# Patient Record
Sex: Female | Born: 1985
Health system: Southern US, Community
[De-identification: ages and names within clinical notes are randomized; demographics above are authoritative.]

## PROBLEM LIST (undated history)

## (undated) DIAGNOSIS — O24419 Gestational diabetes mellitus in pregnancy, unspecified control: Secondary | ICD-10-CM

## (undated) DIAGNOSIS — O418X90 Other specified disorders of amniotic fluid and membranes, unspecified trimester, not applicable or unspecified: Secondary | ICD-10-CM

## (undated) DIAGNOSIS — O468X9 Other antepartum hemorrhage, unspecified trimester: Secondary | ICD-10-CM

## (undated) DIAGNOSIS — O021 Missed abortion: Secondary | ICD-10-CM

## (undated) DIAGNOSIS — E282 Polycystic ovarian syndrome: Secondary | ICD-10-CM

## (undated) HISTORY — DX: Gestational diabetes mellitus in pregnancy, unspecified control: O24.419

## (undated) HISTORY — PX: DILATION AND CURETTAGE OF UTERUS: SHX78

## (undated) HISTORY — DX: Polycystic ovarian syndrome: E28.2

---

## 2016-06-18 DIAGNOSIS — Z124 Encounter for screening for malignant neoplasm of cervix: Secondary | ICD-10-CM | POA: Diagnosis not present

## 2016-06-18 DIAGNOSIS — Z1151 Encounter for screening for human papillomavirus (HPV): Secondary | ICD-10-CM | POA: Diagnosis not present

## 2016-06-18 DIAGNOSIS — Z01419 Encounter for gynecological examination (general) (routine) without abnormal findings: Secondary | ICD-10-CM | POA: Diagnosis not present

## 2016-06-18 DIAGNOSIS — Z6822 Body mass index (BMI) 22.0-22.9, adult: Secondary | ICD-10-CM | POA: Diagnosis not present

## 2016-06-18 DIAGNOSIS — N912 Amenorrhea, unspecified: Secondary | ICD-10-CM | POA: Diagnosis not present

## 2016-07-08 DIAGNOSIS — N912 Amenorrhea, unspecified: Secondary | ICD-10-CM | POA: Diagnosis not present

## 2016-12-24 DIAGNOSIS — Z3169 Encounter for other general counseling and advice on procreation: Secondary | ICD-10-CM | POA: Diagnosis not present

## 2016-12-24 DIAGNOSIS — Z6822 Body mass index (BMI) 22.0-22.9, adult: Secondary | ICD-10-CM | POA: Diagnosis not present

## 2016-12-24 DIAGNOSIS — E282 Polycystic ovarian syndrome: Secondary | ICD-10-CM | POA: Diagnosis not present

## 2017-01-19 DIAGNOSIS — N970 Female infertility associated with anovulation: Secondary | ICD-10-CM | POA: Diagnosis not present

## 2017-02-09 DIAGNOSIS — Z3141 Encounter for fertility testing: Secondary | ICD-10-CM | POA: Diagnosis not present

## 2017-03-24 DIAGNOSIS — N979 Female infertility, unspecified: Secondary | ICD-10-CM | POA: Diagnosis not present

## 2017-03-24 DIAGNOSIS — N926 Irregular menstruation, unspecified: Secondary | ICD-10-CM | POA: Diagnosis not present

## 2017-03-24 DIAGNOSIS — E282 Polycystic ovarian syndrome: Secondary | ICD-10-CM | POA: Diagnosis not present

## 2017-03-24 DIAGNOSIS — Z6823 Body mass index (BMI) 23.0-23.9, adult: Secondary | ICD-10-CM | POA: Diagnosis not present

## 2017-04-21 DIAGNOSIS — Z3201 Encounter for pregnancy test, result positive: Secondary | ICD-10-CM | POA: Diagnosis not present

## 2017-04-21 DIAGNOSIS — N926 Irregular menstruation, unspecified: Secondary | ICD-10-CM | POA: Diagnosis not present

## 2017-04-24 DIAGNOSIS — N926 Irregular menstruation, unspecified: Secondary | ICD-10-CM | POA: Diagnosis not present

## 2017-05-01 ENCOUNTER — Emergency Department (HOSPITAL_BASED_OUTPATIENT_CLINIC_OR_DEPARTMENT_OTHER)
Admission: EM | Admit: 2017-05-01 | Discharge: 2017-05-01 | Disposition: A | Discharge status: Home / Self Care | Payer: BLUE CROSS/BLUE SHIELD | Attending: Emergency Medicine | Admitting: Emergency Medicine

## 2017-05-01 ENCOUNTER — Encounter (HOSPITAL_BASED_OUTPATIENT_CLINIC_OR_DEPARTMENT_OTHER): Payer: Self-pay

## 2017-05-01 ENCOUNTER — Emergency Department (HOSPITAL_BASED_OUTPATIENT_CLINIC_OR_DEPARTMENT_OTHER): Payer: BLUE CROSS/BLUE SHIELD

## 2017-05-01 DIAGNOSIS — Z3A01 Less than 8 weeks gestation of pregnancy: Secondary | ICD-10-CM | POA: Diagnosis not present

## 2017-05-01 DIAGNOSIS — O200 Threatened abortion: Secondary | ICD-10-CM | POA: Diagnosis not present

## 2017-05-01 DIAGNOSIS — O26851 Spotting complicating pregnancy, first trimester: Secondary | ICD-10-CM | POA: Diagnosis not present

## 2017-05-01 DIAGNOSIS — O209 Hemorrhage in early pregnancy, unspecified: Secondary | ICD-10-CM | POA: Diagnosis not present

## 2017-05-01 LAB — CBC
HEMATOCRIT: 40.6 % (ref 36.0–46.0)
Hemoglobin: 13.7 g/dL (ref 12.0–15.0)
MCH: 28.2 pg (ref 26.0–34.0)
MCHC: 33.7 g/dL (ref 30.0–36.0)
MCV: 83.5 fL (ref 78.0–100.0)
Platelets: 295 10*3/uL (ref 150–400)
RBC: 4.86 MIL/uL (ref 3.87–5.11)
RDW: 13.1 % (ref 11.5–15.5)
WBC: 8.8 10*3/uL (ref 4.0–10.5)

## 2017-05-01 LAB — ABO/RH: ABO/RH(D): O POS

## 2017-05-01 NOTE — ED Provider Notes (Signed)
I saw and evaluated the patient, reviewed the resident's note and I agree with the findings and plan.   EKG Interpretation None       Patient presents with a threatened miscarriage. Her ultrasound shows a possible bicornuate uterus with implantation in the left horn. Her bleeding has slowed down. There is no abdominal pain. She is not unstable. I discussed with her OB/GYN practice on-call, Dr. Amado Nash, who notes it would be odd for this to be a true bicornuate uterus given she had an unremarkable ultrasound in November 2017. Asks for a CBC for baseline hemoglobin, give patient return precautions for bleeding, and follow-up with their practice on Monday in 3 days. She already has an ultrasound scheduled for then. She is O+, no need for rhogam.   Pricilla Loveless, MD 05/01/17 2240

## 2017-05-01 NOTE — ED Notes (Signed)
Pt returned from [place], resting comfortably on stretcher in NAD.

## 2017-05-01 NOTE — ED Provider Notes (Signed)
[facility] Provider Note   CSN: [id number] Arrival date & time: 05/01/17  1902  History   Chief Complaint Chief Complaint  Patient presents with  . Vaginal Bleeding    HPI Bonnie Thornton is a 31 y.o. female presenting with vaginal bleeding that she noticed right after work. She is [redacted] weeks pregnant. She went to the bathroom after work and noticed blood when she wiped. She put on a pad and proceeded to come to ED. She felt more blood leaking out during transport here. She denies any pelvic pain or vaginal cramping. No recent intercourse. No abnormal vaginal discharge. No urinary complaints. No fevers or chills. This is her first pregnancy. She has PCOS and had difficulty conceiving. Husband is at bedside.   HPI  History reviewed. No pertinent past medical history.  There are no active problems to display for this patient.   History reviewed. No pertinent surgical history.  OB History    Gravida Para Term Preterm AB Living   1             SAB TAB Ectopic Multiple Live Births                 Home Medications    Prior to Admission medications   Medication Sig Start Date End Date Taking? Authorizing Provider  METFORMIN HCL PO Take by mouth.   Yes [provider]   Family History No family history on file.  Social History Social History  Substance Use Topics  . Smoking status: Never Smoker  . Smokeless tobacco: Never Used  . Alcohol use No   Allergies   Patient has no known allergies.  Review of Systems Review of Systems  Constitutional: Negative for chills and fatigue.  Respiratory: Negative for shortness of breath.   Cardiovascular: Negative for chest pain.  Gastrointestinal: Negative for abdominal pain, constipation, diarrhea, nausea and vomiting.  Genitourinary: Positive for vaginal bleeding. Negative for dysuria, flank pain, hematuria, pelvic pain, urgency, vaginal discharge and vaginal pain.  Musculoskeletal: Negative for back pain and  myalgias.  Skin: Negative for rash.  Neurological: Negative for numbness and headaches.    Physical Exam Updated Vital Signs BP 107/67 (BP Location: Right Arm)   Pulse 77   Temp 98.2 F (36.8 C) (Oral)   Resp 16   Ht  (1.626 m)   Wt 60.8 kg (134 lb 0.6 oz)   LMP 03/02/2017   SpO2 100%   BMI 23.01 kg/m   Physical Exam  Constitutional: She is oriented to person, place, and time. She appears well-developed and well-nourished. No distress.  HENT:  Head: Normocephalic and atraumatic.  Cardiovascular: Normal rate and regular rhythm.   Pulmonary/Chest: Effort normal and breath sounds normal.  Abdominal: Soft. There is no tenderness.  Genitourinary: Rectum normal. There is no lesion on the right labia. There is no lesion on the left labia. There is bleeding in the vagina.  Genitourinary Comments: Moderate amount of blood in vaginal canal, no clots or fetal tissue appreciated. Bleeding coming from non-dilated cervical os  Musculoskeletal: Normal range of motion. She exhibits no tenderness.  Neurological: She is alert and oriented to person, place, and time. She exhibits normal muscle tone.  Skin: Skin is warm and dry. No rash noted.  Psychiatric: She has a normal mood and affect. Her behavior is normal.     ED Treatments / Results  Labs (all labs ordered are listed, but only abnormal results are displayed) Labs Reviewed  CBC  ABO/RH    EKG  EKG Interpretation None       Radiology US Ob Comp < 14 Wks  Result Date: 05/01/2017 CLINICAL DATA:  Pregnant patient in first-trimester pregnancy with vaginal bleeding today. EXAM: OBSTETRIC <14 WK [place] AND TRANSVAGINAL OB US TECHNIQUE: Both transabdominal and transvaginal ultrasound examinations were performed for complete evaluation of the gestation as well as the maternal uterus, adnexal regions, and pelvic cul-de-sac. Transvaginal technique was performed to assess early pregnancy. COMPARISON:  None. FINDINGS: Intrauterine  gestational sac: Single, located high in position in the left uterine horn. Yolk sac:  Visualized. Embryo:  Visualized. Cardiac Activity: Visualized. Heart Rate: 119  bpm CRL:  5.3  mm   6 w   2 d                  [place] EDC: 12/24/2017 Subchorionic hemorrhage:  Small measuring 9 x 9 x 5 mm. Maternal uterus/adnexae: Probable bicornuate uterus with IUP in the left uterine horn located peripherally. The right endometrial cavity is thickened measuring 2.3 cm. Small amount of fluid in the cervical canal distally. Neither ovary was visualized. No pelvic free fluid. IMPRESSION: Bicornuate uterus with live IUP in the left uterine horn, estimated gestational age [redacted] weeks 2 days for estimated date of delivery 12/23/2017. The gestational sac appear eccentric in location raising concern for interstitial pregnancy, however on clear is this is simply related to uterine anomaly. There is a small amount of subchorionic hemorrhage. Recommend obstetric consultation. These results were called by telephone at the time of interpretation on 05/01/2017 at 9:45 pm to Dr. Pricilla Loveless , who verbally acknowledged these results. Electronically Signed   By: Rubye Oaks M.D.   On: 05/01/2017 21:46   US Ob Transvaginal  Result Date: 05/01/2017 CLINICAL DATA:  Pregnant patient in first-trimester pregnancy with vaginal bleeding today. EXAM: OBSTETRIC <14 WK [place] AND TRANSVAGINAL OB US TECHNIQUE: Both transabdominal and transvaginal ultrasound examinations were performed for complete evaluation of the gestation as well as the maternal uterus, adnexal regions, and pelvic cul-de-sac. Transvaginal technique was performed to assess early pregnancy. COMPARISON:  None. FINDINGS: Intrauterine gestational sac: Single, located high in position in the left uterine horn. Yolk sac:  Visualized. Embryo:  Visualized. Cardiac Activity: Visualized. Heart Rate: 119  bpm CRL:  5.3  mm   6 w   2 d                  [place] EDC: 12/24/2017 Subchorionic hemorrhage:   Small measuring 9 x 9 x 5 mm. Maternal uterus/adnexae: Probable bicornuate uterus with IUP in the left uterine horn located peripherally. The right endometrial cavity is thickened measuring 2.3 cm. Small amount of fluid in the cervical canal distally. Neither ovary was visualized. No pelvic free fluid. IMPRESSION: Bicornuate uterus with live IUP in the left uterine horn, estimated gestational age [redacted] weeks 2 days for estimated date of delivery 12/23/2017. The gestational sac appear eccentric in location raising concern for interstitial pregnancy, however on clear is this is simply related to uterine anomaly. There is a small amount of subchorionic hemorrhage. Recommend obstetric consultation. These results were called by telephone at the time of interpretation on 05/01/2017 at 9:45 pm to Dr. Pricilla Loveless , who verbally acknowledged these results. Electronically Signed   By: Rubye Oaks M.D.   On: 05/01/2017 21:46    Procedures Procedures (including critical care time)  Medications Ordered in ED Medications - No data to display   Initial Impression /  Assessment and Plan / ED Course  I have reviewed the triage vital signs and the nursing notes.  Pertinent labs & imaging results that were available during my care of the patient were reviewed by me and considered in my medical decision making (see chart for details).     31 year old G1P0000 at [redacted] weeks GA presenting with vaginal bleeding consistent with threatened miscarriage. Moderate amount of blood on speculum exam, no fetal tissue appreciated, cervical os appears closed. US demonstrates [redacted]w[redacted]d IUP with small subchorionic hemorrhage. Some evidence of bicornuate uterus on [place]. Discussed case with patient's OB at [facility] (sees Dr. Juliene Pina). Patient has follow up at their practice for ultrasound on Monday 9/17. Stable for discharge home. Return precautions given for pelvic pain/cramping, further bleeding. Advised patient to go to [facility]  for pregnancy related concerns. Patient verbalized understanding and agreement with plan.   Final Clinical Impressions(s) / ED Diagnoses   Final diagnoses:  Threatened miscarriage    New Prescriptions New Prescriptions   No medications on file    Dolores Patty, DO PGY-2, [facility] 05/01/2017 10:28 PM    Tillman Sers, DO 05/01/17 2228    Pricilla Loveless, MD 05/01/17 2306

## 2017-05-01 NOTE — ED Notes (Signed)
ED Provider at bedside. 

## 2017-05-01 NOTE — ED Triage Notes (Addendum)
C/o vaginal bleeding x 30 min -pt estimates she is [redacted] weeks pregnant-BHCG at OB/GYN-due for [place] next week-NAD-steady gait

## 2017-05-01 NOTE — ED Notes (Signed)
Patient transported to Ultrasound 

## 2017-05-04 DIAGNOSIS — Z3A01 Less than 8 weeks gestation of pregnancy: Secondary | ICD-10-CM | POA: Diagnosis not present

## 2017-05-04 DIAGNOSIS — Z3201 Encounter for pregnancy test, result positive: Secondary | ICD-10-CM | POA: Diagnosis not present

## 2017-05-04 DIAGNOSIS — O4691 Antepartum hemorrhage, unspecified, first trimester: Secondary | ICD-10-CM | POA: Diagnosis not present

## 2017-05-18 DIAGNOSIS — O418X90 Other specified disorders of amniotic fluid and membranes, unspecified trimester, not applicable or unspecified: Secondary | ICD-10-CM

## 2017-05-18 DIAGNOSIS — Z3201 Encounter for pregnancy test, result positive: Secondary | ICD-10-CM | POA: Diagnosis not present

## 2017-05-18 HISTORY — DX: Other specified disorders of amniotic fluid and membranes, unspecified trimester, not applicable or unspecified: O41.8X90

## 2017-06-01 DIAGNOSIS — Z3A01 Less than 8 weeks gestation of pregnancy: Secondary | ICD-10-CM | POA: Diagnosis not present

## 2017-06-01 DIAGNOSIS — O418X30 Other specified disorders of amniotic fluid and membranes, third trimester, not applicable or unspecified: Secondary | ICD-10-CM | POA: Diagnosis not present

## 2017-06-08 DIAGNOSIS — Z3A11 11 weeks gestation of pregnancy: Secondary | ICD-10-CM | POA: Diagnosis not present

## 2017-06-08 DIAGNOSIS — Z3491 Encounter for supervision of normal pregnancy, unspecified, first trimester: Secondary | ICD-10-CM | POA: Diagnosis not present

## 2017-06-08 DIAGNOSIS — Z3682 Encounter for antenatal screening for nuchal translucency: Secondary | ICD-10-CM | POA: Diagnosis not present

## 2017-06-08 DIAGNOSIS — O208 Other hemorrhage in early pregnancy: Secondary | ICD-10-CM | POA: Diagnosis not present

## 2017-06-08 DIAGNOSIS — Z36 Encounter for antenatal screening for chromosomal anomalies: Secondary | ICD-10-CM | POA: Diagnosis not present

## 2017-06-08 DIAGNOSIS — Z3689 Encounter for other specified antenatal screening: Secondary | ICD-10-CM | POA: Diagnosis not present

## 2017-06-08 DIAGNOSIS — Z118 Encounter for screening for other infectious and parasitic diseases: Secondary | ICD-10-CM | POA: Diagnosis not present

## 2017-06-08 DIAGNOSIS — O418X10 Other specified disorders of amniotic fluid and membranes, first trimester, not applicable or unspecified: Secondary | ICD-10-CM | POA: Diagnosis not present

## 2017-06-09 ENCOUNTER — Inpatient Hospital Stay (HOSPITAL_COMMUNITY): Payer: BLUE CROSS/BLUE SHIELD

## 2017-06-09 ENCOUNTER — Encounter: Payer: Self-pay | Admitting: Student

## 2017-06-09 ENCOUNTER — Inpatient Hospital Stay (EMERGENCY_DEPARTMENT_HOSPITAL)
Admission: AD | Admit: 2017-06-09 | Discharge: 2017-06-09 | Disposition: A | Discharge status: Home / Self Care | Payer: BLUE CROSS/BLUE SHIELD | Source: Ambulatory Visit | Attending: Obstetrics and Gynecology | Admitting: Obstetrics and Gynecology

## 2017-06-09 DIAGNOSIS — O021 Missed abortion: Secondary | ICD-10-CM | POA: Diagnosis not present

## 2017-06-09 DIAGNOSIS — Z3A11 11 weeks gestation of pregnancy: Secondary | ICD-10-CM

## 2017-06-09 DIAGNOSIS — O418X10 Other specified disorders of amniotic fluid and membranes, first trimester, not applicable or unspecified: Secondary | ICD-10-CM | POA: Diagnosis not present

## 2017-06-09 DIAGNOSIS — O208 Other hemorrhage in early pregnancy: Secondary | ICD-10-CM

## 2017-06-09 DIAGNOSIS — O209 Hemorrhage in early pregnancy, unspecified: Secondary | ICD-10-CM

## 2017-06-09 DIAGNOSIS — R109 Unspecified abdominal pain: Secondary | ICD-10-CM

## 2017-06-09 HISTORY — DX: Other specified disorders of amniotic fluid and membranes, unspecified trimester, not applicable or unspecified: O41.8X90

## 2017-06-09 HISTORY — DX: Other antepartum hemorrhage, unspecified trimester: O46.8X9

## 2017-06-09 LAB — URINALYSIS, ROUTINE W REFLEX MICROSCOPIC

## 2017-06-09 LAB — URINALYSIS, MICROSCOPIC (REFLEX)

## 2017-06-09 MED ORDER — OXYCODONE-ACETAMINOPHEN 5-325 MG PO TABS: 1.0000 | ORAL_TABLET | ORAL | 0 refills | Status: DC | PRN

## 2017-06-09 NOTE — MAU Note (Signed)
Pt reports she is [redacted] weeks pregnant and is having some bleeding off/on throughout the pregnancy but today she began passing a lot of clots and this pm she began having lower abd cramping. +FHT's in office today.

## 2017-06-09 NOTE — Discharge Instructions (Signed)

## 2017-06-09 NOTE — MAU Note (Signed)
Went to Doppler FHR- the doppler was reading 165-169 but could not physically hear HR. Notified provider and US ordered.

## 2017-06-09 NOTE — MAU Provider Note (Signed)
History     CSN: [id number]  Arrival date and time: 06/09/17 7829   First Provider Initiated Contact with Patient 06/09/17 1942      Chief Complaint  Patient presents with  . Abdominal Pain  . Vaginal Bleeding   HPI Bonnie Thornton is a 31 y.o. G1P0 at [redacted]w[redacted]d who presents with vaginal bleeding & abdominal cramping. Being followed in office for subchorionic hematoma that was measuring 4 cm in office yesterday. Reports increase in bleeding with passage of clots since last night. Went to office this morning d/t bleeding & states they heard FHTs by doppler this morning. Bleeding has slowed down since last night; has passed occasional quarter sized clot. Lower abdominal cramping since this afternoon. Rates pain 7/10. Hasn't treated pain.  OB History    Gravida Para Term Preterm AB Living   1             SAB TAB Ectopic Multiple Live Births                  Past Medical History:  Diagnosis Date  . Subcortical hemorrhage [facility])     Past Surgical History:  Procedure Laterality Date  . NO PAST SURGERIES      History reviewed. No pertinent family history.  Social History  Substance Use Topics  . Smoking status: Never Smoker  . Smokeless tobacco: Never Used  . Alcohol use No    Allergies: No Known Allergies  Prescriptions Prior to Admission  Medication Sig Dispense Refill Last Dose  . metFORMIN (GLUCOPHAGE-XR) 750 MG 24 hr tablet Take 750 mg by mouth daily with breakfast.   06/08/2017 at Unknown time  . Prenatal Vit-Fe Fumarate-FA (MULTIVITAMIN-PRENATAL) 27-0.8 MG TABS tablet Take 1 tablet by mouth daily at 12 noon.   06/09/2017 at Unknown time  . progesterone (PROMETRIUM) 200 MG capsule Take 200 mg by mouth daily.   06/08/2017 at Unknown time    Review of Systems  Constitutional: Negative.   Gastrointestinal: Positive for abdominal pain.  Genitourinary: Positive for vaginal bleeding.   Physical Exam   Blood pressure 95/73, pulse 82, temperature 97.9 F (36.6 C),  temperature source Oral, resp. rate 17, height 5\' 4"  (1.626 m), weight 132 lb (59.9 kg), last menstrual period 03/02/2017, SpO2 100 %.  Physical Exam  Nursing note and vitals reviewed. Constitutional: She is oriented to person, place, and time. She appears well-developed and well-nourished. No distress.  HENT:  Head: Normocephalic and atraumatic.  Eyes: Conjunctivae are normal. Right eye exhibits no discharge. Left eye exhibits no discharge. No scleral icterus.  Neck: Normal range of motion.  Cardiovascular: Normal rate, regular rhythm and normal heart sounds.   No murmur heard. Respiratory: Effort normal and breath sounds normal. No respiratory distress. She has no wheezes.  GI: Soft. She exhibits no distension. There is tenderness in the suprapubic area. There is no rigidity and no guarding.  Genitourinary:  Genitourinary Comments: Dilation: Closed Exam by:: Judeth Horn, NP  Small amount of dark red blood on glove  Neurological: She is alert and oriented to person, place, and time.  Skin: Skin is warm and dry. She is not diaphoretic.  Psychiatric: She has a normal mood and affect. Her behavior is normal. Judgment and thought content normal.    MAU Course  Procedures Results for orders placed or performed during the hospital encounter of 06/09/17 (from the past 24 hour(s))  Urinalysis, Routine w reflex microscopic     Status: Abnormal   Collection Time: 06/09/17  7:01 PM  Result Value Ref Range   Color, Urine RED (A) YELLOW   APPearance TURBID (A) CLEAR   Specific Gravity, Urine  1.005 - 1.030    TEST NOT REPORTED DUE TO COLOR INTERFERENCE OF URINE PIGMENT   pH  5.0 - 8.0    TEST NOT REPORTED DUE TO COLOR INTERFERENCE OF URINE PIGMENT   Glucose, UA (A) NEGATIVE mg/dL    TEST NOT REPORTED DUE TO COLOR INTERFERENCE OF URINE PIGMENT   Hgb urine dipstick (A) NEGATIVE    TEST NOT REPORTED DUE TO COLOR INTERFERENCE OF URINE PIGMENT   Bilirubin Urine (A) NEGATIVE    TEST NOT  REPORTED DUE TO COLOR INTERFERENCE OF URINE PIGMENT   Ketones, ur (A) NEGATIVE mg/dL    TEST NOT REPORTED DUE TO COLOR INTERFERENCE OF URINE PIGMENT   Protein, ur (A) NEGATIVE mg/dL    TEST NOT REPORTED DUE TO COLOR INTERFERENCE OF URINE PIGMENT   Nitrite (A) NEGATIVE    TEST NOT REPORTED DUE TO COLOR INTERFERENCE OF URINE PIGMENT   Leukocytes, UA (A) NEGATIVE    TEST NOT REPORTED DUE TO COLOR INTERFERENCE OF URINE PIGMENT  Urinalysis, Microscopic (reflex)     Status: Abnormal   Collection Time: 06/09/17  7:01 PM  Result Value Ref Range   RBC / HPF TOO NUMEROUS TO COUNT 0 - 5 RBC/hpf   WBC, UA 0-5 0 - 5 WBC/hpf   Bacteria, UA FEW (A) NONE SEEN   Squamous Epithelial / LPF 0-5 (A) NONE SEEN   US Ob Comp Less 14 Wks  Result Date: 06/09/2017 CLINICAL DATA:  Lower abdominal cramping and vaginal bleeding EXAM: OBSTETRIC <14 WK ULTRASOUND TECHNIQUE: Transabdominal ultrasound was performed for evaluation of the gestation as well as the maternal uterus and adnexal regions. COMPARISON:  05/01/2017 FINDINGS: Intrauterine gestational sac: Visible Yolk sac:  Not visible Embryo:  Visible Cardiac Activity: Not visible CRL:   49  mm   11 w 4 d                  [place] EDC: 12/25/2017 Subchorionic hemorrhage:  None visualized. Maternal uterus/adnexae: Both ovaries are nonvisualized. IMPRESSION: 1. Intrauterine pregnancy with crown-rump length of 49 mm an no fetal cardiac activity. Findings meet definitive criteria for failed pregnancy. This follows SRU consensus guidelines: Diagnostic Criteria for Nonviable Pregnancy Early in the First Trimester. Macy Mis J Med [phone]. 2. 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 Findings could be secondary to septate uterus configuration versus bicornuate uterus. Nonemergent pelvic MRI if not already performed could help differentiate between the 2 entities. Electronically Signed   By: Jasmine Pang M.D.   On: 06/09/2017 20:27   MDM O positive RN  unsure if she got FHTs by doppler; doppler attempted by myself & unable to hear FHTs. Ultrasound ordered for viability.  Cervix closed/thick/posterior.  Care turned over to Dr. Randol Kern, Denny Peon, NP 06/09/2017 8:07 PM   [place] read as above. Patient with no FHT. Missed abortion. Discussed with on-call provider Dr. Billy Coast. Assessment and Plan   1. [redacted] weeks gestation of pregnancy   2. Vaginal bleeding in pregnancy, first trimester   3. Vaginal bleeding affecting early pregnancy   4. Abdominal cramping   5. Missed abortion    Miscarriage precautions given Hemodynamically stable for discharge Patient to return if symptoms or her condition worsens Rx for pain medication given To follow-up in office tomorrow with [facility] provider for further management  Caryl AdaJazma Phelps, DO OB Fellow  Discussed with provider See note Minimal bleeding and cramping To be seen in office in am for plan of care

## 2017-06-09 NOTE — Progress Notes (Signed)
Notified by provider Presented with cramping and spotting. BP 95/73 (BP Location: Right Arm)   Pulse 82   Temp 97.9 F (36.6 C) (Oral)   Resp 17   Ht 5\' 4"  (1.626 m)   Wt 59.9 kg (132 lb)   LMP 03/02/2017   SpO2 100%   BMI 22.66 kg/m   Sono c/w MAB Non urgent clinical situation Pt to be seen tomorrow by Dr. Juliene PinaMody to determine plan of management

## 2017-06-10 ENCOUNTER — Ambulatory Visit (HOSPITAL_COMMUNITY)
Admission: AD | Admit: 2017-06-10 | Discharge: 2017-06-10 | Disposition: A | Discharge status: Home / Self Care | Payer: BLUE CROSS/BLUE SHIELD | Source: Ambulatory Visit | Attending: Obstetrics and Gynecology | Admitting: Obstetrics and Gynecology

## 2017-06-10 ENCOUNTER — Ambulatory Visit: Admit: 2017-06-10 | Payer: BLUE CROSS/BLUE SHIELD | Admitting: Obstetrics & Gynecology

## 2017-06-10 ENCOUNTER — Encounter (HOSPITAL_COMMUNITY)
Admission: AD | Disposition: A | Discharge status: Home / Self Care | Payer: Self-pay | Source: Ambulatory Visit | Attending: Obstetrics and Gynecology

## 2017-06-10 ENCOUNTER — Encounter (HOSPITAL_COMMUNITY): Payer: Self-pay | Admitting: *Deleted

## 2017-06-10 ENCOUNTER — Other Ambulatory Visit: Payer: Self-pay | Admitting: Obstetrics & Gynecology

## 2017-06-10 ENCOUNTER — Inpatient Hospital Stay (HOSPITAL_COMMUNITY): Payer: BLUE CROSS/BLUE SHIELD | Admitting: Anesthesiology

## 2017-06-10 DIAGNOSIS — O021 Missed abortion: Secondary | ICD-10-CM | POA: Diagnosis not present

## 2017-06-10 HISTORY — PX: DILATION AND EVACUATION: SHX1459

## 2017-06-10 HISTORY — DX: Missed abortion: O02.1

## 2017-06-10 LAB — CBC
HCT: 38.9 % (ref 36.0–46.0)
Hemoglobin: 13.3 g/dL (ref 12.0–15.0)
MCH: 28.4 pg (ref 26.0–34.0)
MCHC: 34.2 g/dL (ref 30.0–36.0)
MCV: 82.9 fL (ref 78.0–100.0)
PLATELETS: 282 10*3/uL (ref 150–400)
RBC: 4.69 MIL/uL (ref 3.87–5.11)
RDW: 13.3 % (ref 11.5–15.5)
WBC: 10.1 10*3/uL (ref 4.0–10.5)

## 2017-06-10 SURGERY — DILATION AND EVACUATION, UTERUS
Anesthesia: General | Site: Vagina

## 2017-06-10 MED ORDER — CEFAZOLIN SODIUM-DEXTROSE 2-3 GM-%(50ML) IV SOLR
INTRAVENOUS | Status: DC | PRN
  Administered 2017-06-10: 2 g via INTRAVENOUS

## 2017-06-10 MED ORDER — FENTANYL CITRATE (PF) 100 MCG/2ML IJ SOLN
INTRAMUSCULAR | Status: AC
  Filled 2017-06-10: qty 2

## 2017-06-10 MED ORDER — LIDOCAINE HCL (CARDIAC) 20 MG/ML IV SOLN
INTRAVENOUS | Status: DC | PRN
  Administered 2017-06-10: 80 mg via INTRAVENOUS

## 2017-06-10 MED ORDER — DEXAMETHASONE SODIUM PHOSPHATE 10 MG/ML IJ SOLN
INTRAMUSCULAR | Status: AC
  Filled 2017-06-10: qty 1

## 2017-06-10 MED ORDER — OXYCODONE HCL 5 MG PO TABS
5.0000 mg | ORAL_TABLET | Freq: Once | ORAL | Status: AC | PRN
  Administered 2017-06-10: 5 mg via ORAL

## 2017-06-10 MED ORDER — MEPERIDINE HCL 25 MG/ML IJ SOLN: 6.2500 mg | INTRAMUSCULAR | Status: DC | PRN

## 2017-06-10 MED ORDER — FENTANYL CITRATE (PF) 100 MCG/2ML IJ SOLN
INTRAMUSCULAR | Status: DC | PRN
  Administered 2017-06-10: 100 ug via INTRAVENOUS

## 2017-06-10 MED ORDER — SOD CITRATE-CITRIC ACID 500-334 MG/5ML PO SOLN
30.0000 mL | Freq: Once | ORAL | Status: AC
  Administered 2017-06-10: 30 mL via ORAL
  Filled 2017-06-10: qty 15

## 2017-06-10 MED ORDER — DEXAMETHASONE SODIUM PHOSPHATE 10 MG/ML IJ SOLN
INTRAMUSCULAR | Status: DC | PRN
  Administered 2017-06-10: 10 mg via INTRAVENOUS

## 2017-06-10 MED ORDER — PROMETHAZINE HCL 25 MG/ML IJ SOLN: 6.2500 mg | INTRAMUSCULAR | Status: DC | PRN

## 2017-06-10 MED ORDER — PROPOFOL 10 MG/ML IV BOLUS
INTRAVENOUS | Status: DC | PRN
Start: 2017-06-10 — End: 2017-06-10
  Administered 2017-06-10: 200 mg via INTRAVENOUS

## 2017-06-10 MED ORDER — SCOPOLAMINE 1 MG/3DAYS TD PT72
MEDICATED_PATCH | TRANSDERMAL | Status: AC
  Filled 2017-06-10: qty 1

## 2017-06-10 MED ORDER — CEFAZOLIN SODIUM-DEXTROSE 2-3 GM-%(50ML) IV SOLR
INTRAVENOUS | Status: AC
  Filled 2017-06-10: qty 50

## 2017-06-10 MED ORDER — OXYCODONE HCL 5 MG/5ML PO SOLN: 5.0000 mg | Freq: Once | ORAL | Status: AC | PRN

## 2017-06-10 MED ORDER — METHYLERGONOVINE MALEATE 0.2 MG/ML IJ SOLN
INTRAMUSCULAR | Status: AC
  Filled 2017-06-10: qty 1

## 2017-06-10 MED ORDER — PHENYLEPHRINE HCL 10 MG/ML IJ SOLN
INTRAMUSCULAR | Status: DC | PRN
  Administered 2017-06-10: 80 ug via INTRAVENOUS

## 2017-06-10 MED ORDER — CHLOROPROCAINE HCL 1 % IJ SOLN
INTRAMUSCULAR | Status: DC | PRN
  Administered 2017-06-10: 10 mL

## 2017-06-10 MED ORDER — ONDANSETRON HCL 4 MG/2ML IJ SOLN
INTRAMUSCULAR | Status: AC
  Filled 2017-06-10: qty 2

## 2017-06-10 MED ORDER — MIDAZOLAM HCL 2 MG/2ML IJ SOLN
INTRAMUSCULAR | Status: AC
  Filled 2017-06-10: qty 2

## 2017-06-10 MED ORDER — SCOPOLAMINE 1 MG/3DAYS TD PT72
MEDICATED_PATCH | TRANSDERMAL | Status: DC | PRN
  Administered 2017-06-10: 1 via TRANSDERMAL

## 2017-06-10 MED ORDER — METHYLERGONOVINE MALEATE 0.2 MG/ML IJ SOLN
INTRAMUSCULAR | Status: DC | PRN
  Administered 2017-06-10: 0.2 mg via INTRAMUSCULAR

## 2017-06-10 MED ORDER — HYDROMORPHONE HCL 1 MG/ML IJ SOLN: 0.2500 mg | INTRAMUSCULAR | Status: DC | PRN

## 2017-06-10 MED ORDER — KETOROLAC TROMETHAMINE 30 MG/ML IJ SOLN: 30.0000 mg | Freq: Once | INTRAMUSCULAR | Status: DC | PRN

## 2017-06-10 MED ORDER — LACTATED RINGERS IV SOLN
INTRAVENOUS | Status: DC
  Administered 2017-06-10 (×2): via INTRAVENOUS

## 2017-06-10 MED ORDER — PHENYLEPHRINE 40 MCG/ML (10ML) SYRINGE FOR IV PUSH (FOR BLOOD PRESSURE SUPPORT)
PREFILLED_SYRINGE | INTRAVENOUS | Status: AC
  Filled 2017-06-10: qty 10

## 2017-06-10 MED ORDER — LIDOCAINE HCL (CARDIAC) 20 MG/ML IV SOLN
INTRAVENOUS | Status: AC
  Filled 2017-06-10: qty 5

## 2017-06-10 MED ORDER — PROPOFOL 10 MG/ML IV BOLUS
INTRAVENOUS | Status: AC
  Filled 2017-06-10: qty 20

## 2017-06-10 MED ORDER — FAMOTIDINE IN NACL 20-0.9 MG/50ML-% IV SOLN
20.0000 mg | Freq: Once | INTRAVENOUS | Status: AC
  Administered 2017-06-10: 20 mg via INTRAVENOUS
  Filled 2017-06-10: qty 50

## 2017-06-10 MED ORDER — ONDANSETRON HCL 4 MG/2ML IJ SOLN
INTRAMUSCULAR | Status: DC | PRN
  Administered 2017-06-10: 4 mg via INTRAVENOUS

## 2017-06-10 MED ORDER — OXYCODONE HCL 5 MG PO TABS
ORAL_TABLET | ORAL | Status: AC
  Filled 2017-06-10: qty 1

## 2017-06-10 MED ORDER — LACTATED RINGERS IV BOLUS (SEPSIS)
1000.0000 mL | Freq: Once | INTRAVENOUS | Status: AC
  Administered 2017-06-10: 1000 mL via INTRAVENOUS

## 2017-06-10 MED ORDER — IBUPROFEN 200 MG PO TABS: 600.0000 mg | ORAL_TABLET | Freq: Four times a day (QID) | ORAL | 0 refills | Status: DC | PRN

## 2017-06-10 SURGICAL SUPPLY — 16 items
DECANTER SPIKE VIAL GLASS SM (MISCELLANEOUS) ×2 IMPLANT
GLOVE BIO SURGEON STRL SZ7 (GLOVE) ×2 IMPLANT
GLOVE BIOGEL PI IND STRL 7.0 (GLOVE) ×2 IMPLANT
GLOVE BIOGEL PI INDICATOR 7.0 (GLOVE) ×2
GOWN STRL REUS W/TWL LRG LVL3 (GOWN DISPOSABLE) ×4 IMPLANT
KIT BERKELEY 1ST TRIMESTER 3/8 (MISCELLANEOUS) ×2 IMPLANT
NS IRRIG 1000ML POUR BTL (IV SOLUTION) ×2 IMPLANT
PACK VAGINAL MINOR WOMEN LF (CUSTOM PROCEDURE TRAY) ×2 IMPLANT
PAD OB MATERNITY 4.3X12.25 (PERSONAL CARE ITEMS) ×2 IMPLANT
PAD PREP 24X48 CUFFED NSTRL (MISCELLANEOUS) ×2 IMPLANT
SET BERKELEY SUCTION TUBING (SUCTIONS) ×2 IMPLANT
TOWEL OR 17X24 6PK STRL BLUE (TOWEL DISPOSABLE) ×4 IMPLANT
VACURETTE 10 RIGID CVD (CANNULA) ×2 IMPLANT
VACURETTE 7MM CVD STRL WRAP (CANNULA) IMPLANT
VACURETTE 8 RIGID CVD (CANNULA) IMPLANT
VACURETTE 9 RIGID CVD (CANNULA) IMPLANT

## 2017-06-10 NOTE — H&P (Addendum)
Bonnie Thornton is an 31 y.o. female G1, 11.6 wks, missed abortion by sono last night in MAU and today in office.  Bleeding off and on since start of pregnancy  Healthy female. PCOS  Patient's last menstrual period was 03/02/2017.    Past Medical History:  Diagnosis Date  . Subchorionic hematoma 05/2017    Past Surgical History:  Procedure Laterality Date  . NO PAST SURGERIES      History reviewed. No pertinent family history.  Social History:  reports that she has never smoked. She has never used smokeless tobacco. She reports that she does not drink alcohol or use drugs.  Allergies: No Known Allergies  Prescriptions Prior to Admission  Medication Sig Dispense Refill Last Dose  . metFORMIN (GLUCOPHAGE-XR) 750 MG 24 hr tablet Take 750 mg by mouth daily with breakfast.   06/10/2017 at Unknown time  . Prenatal Vit-Fe Fumarate-FA (MULTIVITAMIN-PRENATAL) 27-0.8 MG TABS tablet Take 1 tablet by mouth daily at 12 noon.   06/09/2017 at Unknown time  . progesterone (PROMETRIUM) 200 MG capsule Take 200 mg by mouth daily.   06/09/2017 at Unknown time  . oxyCODONE-acetaminophen (PERCOCET/ROXICET) 5-325 MG tablet Take 1 tablet by mouth every 4 (four) hours as needed for severe pain. 15 tablet 0     ROS bleeding  Blood pressure 105/65, pulse 82, temperature (!) 97.4 F (36.3 C), temperature source Oral, resp. rate 18, last menstrual period 03/02/2017. Physical Exam Physical exam:  A&O x 3, no acute distress. Pleasant HEENT neg, no thyromegaly Lungs CTA bilat CV RRR, S1S2 normal Abdo soft, non tender, non acute Extr no edema/ tenderness Pelvic Cx f tip, bleeding  FHT  Absent  Results for orders placed or performed during the hospital encounter of 06/10/17 (from the past 24 hour(s))  CBC     Status: None   Collection Time: 06/10/17  3:53 PM  Result Value Ref Range   WBC 10.1 4.0 - 10.5 K/uL   RBC 4.69 3.87 - 5.11 MIL/uL   Hemoglobin 13.3 12.0 - 15.0 g/dL   HCT 40.938.9 81.136.0 - 91.446.0 %    MCV 82.9 78.0 - 100.0 fL   MCH 28.4 26.0 - 34.0 pg   MCHC 34.2 30.0 - 36.0 g/dL   RDW 78.213.3 95.611.5 - 21.315.5 %   Platelets 282 150 - 400 K/uL    [place] Ob Comp Less 14 Wks  Result Date: 06/09/2017 CLINICAL DATA:  Lower abdominal cramping and vaginal bleeding EXAM: OBSTETRIC <14 WK ULTRASOUND TECHNIQUE: Transabdominal ultrasound was performed for evaluation of the gestation as well as the maternal uterus and adnexal regions. COMPARISON:  05/01/2017 FINDINGS: Intrauterine gestational sac: Visible Yolk sac:  Not visible Embryo:  Visible Cardiac Activity: Not visible CRL:   49  mm   11 w 4 d                  US EDC: 12/25/2017 Subchorionic hemorrhage:  None visualized. Maternal uterus/adnexae: Both ovaries are nonvisualized. IMPRESSION: 1. Intrauterine pregnancy with crown-rump length of 49 mm an no fetal cardiac activity. Findings meet definitive criteria for failed pregnancy. This follows SRU consensus guidelines: Diagnostic Criteria for Nonviable Pregnancy Early in the First Trimester. Macy Mis Engl J Med [phone]. 2. 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 Findings could be secondary to septate uterus configuration versus bicornuate uterus. Nonemergent pelvic MRI if not already performed could help differentiate between the 2 entities. Electronically Signed   By: Jasmine PangKim  Fujinaga M.D.   On: 06/09/2017 20:27  Assessment/Plan: 31 yo G1, 11.6 wks, missed abortion after bleeding off and on with subchorionic hemorrhage since start of pregnancy.  Plan suction dilatation and evacuation.  Rh positive Risks/complications of surgery reviewed incl infection, damage to uterus, scarring, bleeding, damage to internal organs including bladder, bowels, ureters, blood vessels, other risks from anesthesia, VTE and delayed complications of any surgery, complications in future surgery reviewed.   Artavious Trebilcock R 06/10/2017, 5:49 PM

## 2017-06-10 NOTE — Discharge Instructions (Signed)

## 2017-06-10 NOTE — Anesthesia Postprocedure Evaluation (Signed)
Anesthesia Post Note  Patient: Bonnie Thornton  Procedure(s) Performed: DILATATION AND EVACUATION (N/A Vagina )     Patient location during evaluation: PACU Anesthesia Type: General Level of consciousness: awake and alert Pain management: pain level controlled Vital Signs Assessment: post-procedure vital signs reviewed and stable Respiratory status: spontaneous breathing, nonlabored ventilation and respiratory function stable Cardiovascular status: blood pressure returned to baseline and stable Postop Assessment: no apparent nausea or vomiting Anesthetic complications: no    Last Vitals:  Vitals:   06/10/17 1850 06/10/17 1900  BP:    Pulse: 85   Resp:  16  Temp:    SpO2:      Last Pain:  Vitals:   06/10/17 1900  TempSrc:   PainSc: 0-No pain   Pain Goal:                 Beryle Lathehomas E Brock

## 2017-06-10 NOTE — Op Note (Signed)
Preoperative diagnosis:  11.6 wks, missed abortion Postop diagnosis: as above.  Procedure: Suction D&E Anesthesia General LMA and Nesacaine paracervical block Surgeon: Shea EvansVaishali Jevaughn Degollado, MD Assistant: none  IV fluids 300 cc LR in OR Estimated blood loss : 50 cc  Urine output:  straight catheter preop 30 cc Complications none Condition stable Disposition PACU  Specimen: products of conception sent to pathology.  Procedure  Indication: Decision was made to proceed with suction D&E since 11.6 wks pregnancy. Risks/ complications of surgery including infection, bleeding, damage to internal organs including uterine perforation, Asherman syndrome were reviewed. She voiced understanding and gave informed written consent.  Patient was brought to the operating room with IV running. She received preop 2 gm Ancef.  She underwent general endotracheal anesthesia without complications. She was given dorsolithotomy position. Parts were prepped and draped in standard fashion. Bladder was catheterized once.  Bimanual exam revealed uterus to be anteverted and 12week size.  Speculum was placed and cervix was grasped with single-tooth tenaculum. 10 cc Nesacaine paracervical block given.  Cervical os was dilated to 29 [place] dilator. A # 10 suction curette was introduced in the uterine cavity and suction evacuation of products of conception was done in multiple step wise fashion. Gentle sharp curettage was performed and endometrium felt gritty. Suction cannula was reintroduced and cavity empties. Bleeding was minimal.   Bedside ultrasound done by me and noted endometrial cavity was clear, no retained tissue seen.  Prophylactic Methergine 0.2 mg IM given due to gestational age.  Prcedure was complete.  All counts are correct x2.  Patient was made supine dorsal anesthesia and brought to the recovery room in stable condition.  Patient will be discharged home today. follow up in 2 weeks in office. Warning signs of  infection and excessive bleeding reviewed.  V.Shy Guallpa, MD.

## 2017-06-10 NOTE — Transfer of Care (Signed)
Immediate Anesthesia Transfer of Care Note  Patient: Bonnie Thornton  Procedure(s) Performed: DILATATION AND EVACUATION (N/A Vagina )  Patient Location: PACU  Anesthesia Type:General  Level of Consciousness: awake, alert  and oriented  Airway & Oxygen Therapy: Patient Spontanous Breathing and Patient connected to nasal cannula oxygen  Post-op Assessment: Report given to RN and Post -op Vital signs reviewed and stable  Post vital signs: Reviewed and stable  Last Vitals:  Vitals:   06/10/17 1552  BP: 105/65  Pulse: 82  Resp: 18  Temp: (!) 36.3 C    Last Pain:  Vitals:   06/10/17 1552  TempSrc: Oral         Complications: No apparent anesthesia complications

## 2017-06-10 NOTE — MAU Note (Signed)
Pt here for add on D&E, for missed AB

## 2017-06-10 NOTE — Anesthesia Preprocedure Evaluation (Signed)
Anesthesia Evaluation  Patient identified by MRN, date of birth, ID band Patient awake    Reviewed: Allergy & Precautions, NPO status , Patient's Chart, lab work & pertinent test results  Airway Mallampati: II  TM Distance: >3 FB Neck ROM: Full    Dental no notable dental hx.    Pulmonary neg pulmonary ROS,    Pulmonary exam normal breath sounds clear to auscultation       Cardiovascular negative cardio ROS Normal cardiovascular exam Rhythm:Regular Rate:Normal     Neuro/Psych negative neurological ROS  negative psych ROS   GI/Hepatic negative GI ROS, Neg liver ROS,   Endo/Other  negative endocrine ROS  Renal/GU negative Renal ROS     Musculoskeletal negative musculoskeletal ROS (+)   Abdominal   Peds  Hematology negative hematology ROS (+)   Anesthesia Other Findings   Reproductive/Obstetrics negative OB ROS                            Anesthesia Physical Anesthesia Plan  ASA: II  Anesthesia Plan: General   Post-op Pain Management:    Induction: Intravenous  PONV Risk Score and Plan: 4 or greater and Ondansetron, Dexamethasone, Midazolam, Scopolamine patch - Pre-op and Treatment may vary due to age or medical condition  Airway Management Planned: LMA  Additional Equipment:   Intra-op Plan:   Post-operative Plan: Extubation in OR  Informed Consent: I have reviewed the patients History and Physical, chart, labs and discussed the procedure including the risks, benefits and alternatives for the proposed anesthesia with the patient or authorized representative who has indicated his/her understanding and acceptance.   Dental advisory given  Plan Discussed with: CRNA  Anesthesia Plan Comments:         Anesthesia Quick Evaluation

## 2017-06-10 NOTE — Anesthesia Procedure Notes (Signed)
Procedure Name: LMA Insertion Date/Time: 06/10/2017 6:10 PM Performed by: Rosa Wyly, Jannet AskewHARLESETTA Thornton Pre-anesthesia Checklist: Patient identified, Emergency Drugs available, Patient being monitored and Timeout performed Patient Re-evaluated:Patient Re-evaluated prior to induction Preoxygenation: Pre-oxygenation with 100% oxygen Induction Type: IV induction LMA Size: 4.0 Number of attempts: 1 ETT to lip (cm): at lips. Dental Injury: Teeth and Oropharynx as per pre-operative assessment

## 2017-06-11 ENCOUNTER — Encounter (HOSPITAL_COMMUNITY): Payer: Self-pay | Admitting: Obstetrics & Gynecology

## 2017-07-08 DIAGNOSIS — O09299 Supervision of pregnancy with other poor reproductive or obstetric history, unspecified trimester: Secondary | ICD-10-CM | POA: Diagnosis not present

## 2017-07-08 DIAGNOSIS — E282 Polycystic ovarian syndrome: Secondary | ICD-10-CM | POA: Diagnosis not present

## 2017-07-08 DIAGNOSIS — Z131 Encounter for screening for diabetes mellitus: Secondary | ICD-10-CM | POA: Diagnosis not present

## 2017-07-08 DIAGNOSIS — N96 Recurrent pregnancy loss: Secondary | ICD-10-CM | POA: Diagnosis not present

## 2017-08-18 NOTE — L&D Delivery Note (Signed)
Delivery Note At 9:52 PM a viable and healthy female was delivered via Vaginal, Spontaneous (Presentation: OA).  APGAR: 8, 9; weight  pending.   Placenta status: spontaneous and complete.  Cord:  with the following complications: None.  Cord pH: N/A  Anesthesia:  Epidural Episiotomy: None Lacerations: partial 3rd degree;Perineal Suture Repair: 3.0 vicryl rapide Est. Blood Loss (mL):  450  Mom to postpartum.  Baby to Couplet care / Skin to Skin.  Robley FriesVaishali R Renezmae Canlas 07/02/2018, 10:58 PM

## 2017-11-05 DIAGNOSIS — Z3A01 Less than 8 weeks gestation of pregnancy: Secondary | ICD-10-CM | POA: Diagnosis not present

## 2017-11-05 DIAGNOSIS — Z3201 Encounter for pregnancy test, result positive: Secondary | ICD-10-CM | POA: Diagnosis not present

## 2017-11-05 DIAGNOSIS — O09291 Supervision of pregnancy with other poor reproductive or obstetric history, first trimester: Secondary | ICD-10-CM | POA: Diagnosis not present

## 2017-11-09 DIAGNOSIS — O09291 Supervision of pregnancy with other poor reproductive or obstetric history, first trimester: Secondary | ICD-10-CM | POA: Diagnosis not present

## 2017-11-09 DIAGNOSIS — Z3A01 Less than 8 weeks gestation of pregnancy: Secondary | ICD-10-CM | POA: Diagnosis not present

## 2017-11-23 DIAGNOSIS — Z3201 Encounter for pregnancy test, result positive: Secondary | ICD-10-CM | POA: Diagnosis not present

## 2017-12-10 DIAGNOSIS — Z113 Encounter for screening for infections with a predominantly sexual mode of transmission: Secondary | ICD-10-CM | POA: Diagnosis not present

## 2017-12-10 DIAGNOSIS — Z36 Encounter for antenatal screening for chromosomal anomalies: Secondary | ICD-10-CM | POA: Diagnosis not present

## 2017-12-10 DIAGNOSIS — Z3689 Encounter for other specified antenatal screening: Secondary | ICD-10-CM | POA: Diagnosis not present

## 2017-12-10 DIAGNOSIS — Z3491 Encounter for supervision of normal pregnancy, unspecified, first trimester: Secondary | ICD-10-CM | POA: Diagnosis not present

## 2017-12-10 DIAGNOSIS — Z124 Encounter for screening for malignant neoplasm of cervix: Secondary | ICD-10-CM | POA: Diagnosis not present

## 2017-12-10 DIAGNOSIS — Z3A09 9 weeks gestation of pregnancy: Secondary | ICD-10-CM | POA: Diagnosis not present

## 2017-12-10 DIAGNOSIS — O09291 Supervision of pregnancy with other poor reproductive or obstetric history, first trimester: Secondary | ICD-10-CM | POA: Diagnosis not present

## 2017-12-10 LAB — OB RESULTS CONSOLE ANTIBODY SCREEN: ANTIBODY SCREEN: NEGATIVE

## 2017-12-10 LAB — OB RESULTS CONSOLE GC/CHLAMYDIA
Chlamydia: NEGATIVE
Gonorrhea: NEGATIVE

## 2017-12-10 LAB — OB RESULTS CONSOLE HIV ANTIBODY (ROUTINE TESTING): HIV: NONREACTIVE

## 2017-12-10 LAB — OB RESULTS CONSOLE ABO/RH: RH Type: POSITIVE

## 2017-12-10 LAB — OB RESULTS CONSOLE RPR: RPR: NONREACTIVE

## 2017-12-10 LAB — OB RESULTS CONSOLE RUBELLA ANTIBODY, IGM: Rubella: IMMUNE

## 2017-12-10 LAB — OB RESULTS CONSOLE HEPATITIS B SURFACE ANTIGEN: HEP B S AG: NEGATIVE

## 2018-01-01 DIAGNOSIS — O09292 Supervision of pregnancy with other poor reproductive or obstetric history, second trimester: Secondary | ICD-10-CM | POA: Diagnosis not present

## 2018-01-01 DIAGNOSIS — Z3A13 13 weeks gestation of pregnancy: Secondary | ICD-10-CM | POA: Diagnosis not present

## 2018-01-01 DIAGNOSIS — Z3682 Encounter for antenatal screening for nuchal translucency: Secondary | ICD-10-CM | POA: Diagnosis not present

## 2018-01-25 DIAGNOSIS — O09292 Supervision of pregnancy with other poor reproductive or obstetric history, second trimester: Secondary | ICD-10-CM | POA: Diagnosis not present

## 2018-01-25 DIAGNOSIS — Z3A16 16 weeks gestation of pregnancy: Secondary | ICD-10-CM | POA: Diagnosis not present

## 2018-01-25 DIAGNOSIS — Z361 Encounter for antenatal screening for raised alphafetoprotein level: Secondary | ICD-10-CM | POA: Diagnosis not present

## 2018-02-19 DIAGNOSIS — Z3A20 20 weeks gestation of pregnancy: Secondary | ICD-10-CM | POA: Diagnosis not present

## 2018-02-19 DIAGNOSIS — O09292 Supervision of pregnancy with other poor reproductive or obstetric history, second trimester: Secondary | ICD-10-CM | POA: Diagnosis not present

## 2018-02-19 DIAGNOSIS — Z363 Encounter for antenatal screening for malformations: Secondary | ICD-10-CM | POA: Diagnosis not present

## 2018-03-03 DIAGNOSIS — K625 Hemorrhage of anus and rectum: Secondary | ICD-10-CM | POA: Diagnosis not present

## 2018-03-15 DIAGNOSIS — O09292 Supervision of pregnancy with other poor reproductive or obstetric history, second trimester: Secondary | ICD-10-CM | POA: Diagnosis not present

## 2018-03-15 DIAGNOSIS — Z3A23 23 weeks gestation of pregnancy: Secondary | ICD-10-CM | POA: Diagnosis not present

## 2018-03-15 DIAGNOSIS — Z362 Encounter for other antenatal screening follow-up: Secondary | ICD-10-CM | POA: Diagnosis not present

## 2018-03-18 ENCOUNTER — Encounter (HOSPITAL_COMMUNITY): Payer: Self-pay

## 2018-03-30 DIAGNOSIS — Z3A25 25 weeks gestation of pregnancy: Secondary | ICD-10-CM | POA: Diagnosis not present

## 2018-03-30 DIAGNOSIS — O09292 Supervision of pregnancy with other poor reproductive or obstetric history, second trimester: Secondary | ICD-10-CM | POA: Diagnosis not present

## 2018-04-09 DIAGNOSIS — Z23 Encounter for immunization: Secondary | ICD-10-CM | POA: Diagnosis not present

## 2018-04-09 DIAGNOSIS — Z3689 Encounter for other specified antenatal screening: Secondary | ICD-10-CM | POA: Diagnosis not present

## 2018-04-09 DIAGNOSIS — O09292 Supervision of pregnancy with other poor reproductive or obstetric history, second trimester: Secondary | ICD-10-CM | POA: Diagnosis not present

## 2018-04-09 DIAGNOSIS — Z3A27 27 weeks gestation of pregnancy: Secondary | ICD-10-CM | POA: Diagnosis not present

## 2018-04-16 DIAGNOSIS — Z3689 Encounter for other specified antenatal screening: Secondary | ICD-10-CM | POA: Diagnosis not present

## 2018-04-16 DIAGNOSIS — O99810 Abnormal glucose complicating pregnancy: Secondary | ICD-10-CM | POA: Diagnosis not present

## 2018-04-16 DIAGNOSIS — Z3A28 28 weeks gestation of pregnancy: Secondary | ICD-10-CM | POA: Diagnosis not present

## 2018-04-27 DIAGNOSIS — Z3A29 29 weeks gestation of pregnancy: Secondary | ICD-10-CM | POA: Diagnosis not present

## 2018-04-27 DIAGNOSIS — O99810 Abnormal glucose complicating pregnancy: Secondary | ICD-10-CM | POA: Diagnosis not present

## 2018-05-10 DIAGNOSIS — Z23 Encounter for immunization: Secondary | ICD-10-CM | POA: Diagnosis not present

## 2018-05-10 DIAGNOSIS — O09293 Supervision of pregnancy with other poor reproductive or obstetric history, third trimester: Secondary | ICD-10-CM | POA: Diagnosis not present

## 2018-05-10 DIAGNOSIS — Z3A31 31 weeks gestation of pregnancy: Secondary | ICD-10-CM | POA: Diagnosis not present

## 2018-05-24 DIAGNOSIS — O24419 Gestational diabetes mellitus in pregnancy, unspecified control: Secondary | ICD-10-CM | POA: Diagnosis not present

## 2018-05-24 DIAGNOSIS — Z3A33 33 weeks gestation of pregnancy: Secondary | ICD-10-CM | POA: Diagnosis not present

## 2018-06-07 DIAGNOSIS — Z3A35 35 weeks gestation of pregnancy: Secondary | ICD-10-CM | POA: Diagnosis not present

## 2018-06-07 DIAGNOSIS — O24419 Gestational diabetes mellitus in pregnancy, unspecified control: Secondary | ICD-10-CM | POA: Diagnosis not present

## 2018-06-07 DIAGNOSIS — Z3685 Encounter for antenatal screening for Streptococcus B: Secondary | ICD-10-CM | POA: Diagnosis not present

## 2018-06-14 DIAGNOSIS — O24414 Gestational diabetes mellitus in pregnancy, insulin controlled: Secondary | ICD-10-CM | POA: Diagnosis not present

## 2018-06-14 DIAGNOSIS — Z3A36 36 weeks gestation of pregnancy: Secondary | ICD-10-CM | POA: Diagnosis not present

## 2018-06-18 ENCOUNTER — Telehealth (HOSPITAL_COMMUNITY): Payer: Self-pay | Admitting: *Deleted

## 2018-06-18 ENCOUNTER — Encounter (HOSPITAL_COMMUNITY): Payer: Self-pay | Admitting: *Deleted

## 2018-06-18 ENCOUNTER — Other Ambulatory Visit: Payer: Self-pay | Admitting: Obstetrics & Gynecology

## 2018-06-18 LAB — OB RESULTS CONSOLE GBS: STREP GROUP B AG: POSITIVE

## 2018-06-21 ENCOUNTER — Encounter (HOSPITAL_COMMUNITY): Payer: Self-pay | Admitting: *Deleted

## 2018-06-21 NOTE — Telephone Encounter (Signed)
Preadmission screen  

## 2018-06-22 DIAGNOSIS — O24414 Gestational diabetes mellitus in pregnancy, insulin controlled: Secondary | ICD-10-CM | POA: Diagnosis not present

## 2018-06-22 DIAGNOSIS — Z3A37 37 weeks gestation of pregnancy: Secondary | ICD-10-CM | POA: Diagnosis not present

## 2018-07-01 DIAGNOSIS — O24414 Gestational diabetes mellitus in pregnancy, insulin controlled: Secondary | ICD-10-CM | POA: Diagnosis not present

## 2018-07-01 DIAGNOSIS — Z3A38 38 weeks gestation of pregnancy: Secondary | ICD-10-CM | POA: Diagnosis not present

## 2018-07-02 ENCOUNTER — Inpatient Hospital Stay (HOSPITAL_COMMUNITY)
Admission: RE | Admit: 2018-07-02 | Discharge: 2018-07-04 | DRG: 768 | Disposition: A | Discharge status: Home / Self Care | Payer: BLUE CROSS/BLUE SHIELD | Attending: Obstetrics & Gynecology | Admitting: Obstetrics & Gynecology

## 2018-07-02 ENCOUNTER — Inpatient Hospital Stay (HOSPITAL_COMMUNITY): Payer: BLUE CROSS/BLUE SHIELD | Admitting: Anesthesiology

## 2018-07-02 ENCOUNTER — Other Ambulatory Visit: Payer: Self-pay

## 2018-07-02 ENCOUNTER — Encounter (HOSPITAL_COMMUNITY): Payer: Self-pay

## 2018-07-02 DIAGNOSIS — Z3A39 39 weeks gestation of pregnancy: Secondary | ICD-10-CM

## 2018-07-02 DIAGNOSIS — O411290 Chorioamnionitis, unspecified trimester, not applicable or unspecified: Secondary | ICD-10-CM | POA: Diagnosis not present

## 2018-07-02 DIAGNOSIS — O411230 Chorioamnionitis, third trimester, not applicable or unspecified: Secondary | ICD-10-CM | POA: Diagnosis not present

## 2018-07-02 DIAGNOSIS — O99824 Streptococcus B carrier state complicating childbirth: Secondary | ICD-10-CM | POA: Diagnosis present

## 2018-07-02 DIAGNOSIS — Z3490 Encounter for supervision of normal pregnancy, unspecified, unspecified trimester: Secondary | ICD-10-CM

## 2018-07-02 DIAGNOSIS — O24425 Gestational diabetes mellitus in childbirth, controlled by oral hypoglycemic drugs: Secondary | ICD-10-CM | POA: Diagnosis not present

## 2018-07-02 DIAGNOSIS — Z3A00 Weeks of gestation of pregnancy not specified: Secondary | ICD-10-CM | POA: Diagnosis not present

## 2018-07-02 DIAGNOSIS — O3663X0 Maternal care for excessive fetal growth, third trimester, not applicable or unspecified: Secondary | ICD-10-CM | POA: Diagnosis not present

## 2018-07-02 DIAGNOSIS — O24424 Gestational diabetes mellitus in childbirth, insulin controlled: Secondary | ICD-10-CM | POA: Diagnosis not present

## 2018-07-02 DIAGNOSIS — O24419 Gestational diabetes mellitus in pregnancy, unspecified control: Secondary | ICD-10-CM

## 2018-07-02 HISTORY — DX: Gestational diabetes mellitus in pregnancy, unspecified control: O24.419

## 2018-07-02 LAB — GLUCOSE, CAPILLARY
GLUCOSE-CAPILLARY: 73 mg/dL (ref 70–99)
GLUCOSE-CAPILLARY: 79 mg/dL (ref 70–99)
Glucose-Capillary: 85 mg/dL (ref 70–99)
Glucose-Capillary: 61 mg/dL — ABNORMAL LOW (ref 70–99)
Glucose-Capillary: 54 mg/dL — ABNORMAL LOW (ref 70–99)
Glucose-Capillary: 82 mg/dL (ref 70–99)

## 2018-07-02 LAB — CBC
HCT: 42.2 % (ref 36.0–46.0)
Hemoglobin: 13.8 g/dL (ref 12.0–15.0)
MCH: 27.9 pg (ref 26.0–34.0)
MCHC: 32.7 g/dL (ref 30.0–36.0)
MCV: 85.4 fL (ref 80.0–100.0)
NRBC: 0 % (ref 0.0–0.2)
PLATELETS: 245 10*3/uL (ref 150–400)
RBC: 4.94 MIL/uL (ref 3.87–5.11)
RDW: 13.4 % (ref 11.5–15.5)
WBC: 7.8 10*3/uL (ref 4.0–10.5)

## 2018-07-02 LAB — TYPE AND SCREEN
ABO/RH(D): O POS
Antibody Screen: NEGATIVE

## 2018-07-02 LAB — ABO/RH: ABO/RH(D): O POS

## 2018-07-02 LAB — RPR: RPR Ser Ql: NONREACTIVE

## 2018-07-02 MED ORDER — MISOPROSTOL 25 MCG QUARTER TABLET
25.0000 ug | ORAL_TABLET | ORAL | Status: DC | PRN
  Administered 2018-07-02 (×2): 25 ug via VAGINAL
  Filled 2018-07-02 (×3): qty 1

## 2018-07-02 MED ORDER — OXYTOCIN 40 UNITS IN LACTATED RINGERS INFUSION - SIMPLE MED: 2.5000 [IU]/h | INTRAVENOUS | Status: DC

## 2018-07-02 MED ORDER — OXYTOCIN 40 UNITS IN LACTATED RINGERS INFUSION - SIMPLE MED
1.0000 m[IU]/min | INTRAVENOUS | Status: DC
  Administered 2018-07-02: 2 m[IU]/min via INTRAVENOUS

## 2018-07-02 MED ORDER — FAMOTIDINE IN NACL 20-0.9 MG/50ML-% IV SOLN
20.0000 mg | Freq: Once | INTRAVENOUS | Status: AC
  Administered 2018-07-02: 20 mg via INTRAVENOUS
  Filled 2018-07-02: qty 50

## 2018-07-02 MED ORDER — SODIUM CHLORIDE 0.9 % IV SOLN
2.0000 g | Freq: Four times a day (QID) | INTRAVENOUS | Status: DC
  Administered 2018-07-02: 2 g via INTRAVENOUS
  Filled 2018-07-02 (×3): qty 2

## 2018-07-02 MED ORDER — LIDOCAINE HCL (PF) 1 % IJ SOLN
INTRAMUSCULAR | Status: DC | PRN
  Administered 2018-07-02: 8 mL

## 2018-07-02 MED ORDER — ACETAMINOPHEN 325 MG PO TABS: 650.0000 mg | ORAL_TABLET | ORAL | Status: DC | PRN

## 2018-07-02 MED ORDER — DIPHENHYDRAMINE HCL 50 MG/ML IJ SOLN: 12.5000 mg | INTRAMUSCULAR | Status: DC | PRN

## 2018-07-02 MED ORDER — EPHEDRINE 5 MG/ML INJ
10.0000 mg | INTRAVENOUS | Status: DC | PRN
  Filled 2018-07-02: qty 2

## 2018-07-02 MED ORDER — EPHEDRINE 5 MG/ML INJ
10.0000 mg | INTRAVENOUS | Status: DC | PRN
  Filled 2018-07-02: qty 2
  Filled 2018-07-02: qty 4

## 2018-07-02 MED ORDER — LACTATED RINGERS IV SOLN
500.0000 mL | INTRAVENOUS | Status: DC | PRN
  Administered 2018-07-02: 1000 mL via INTRAVENOUS

## 2018-07-02 MED ORDER — LIDOCAINE HCL (PF) 1 % IJ SOLN: INTRAMUSCULAR | Status: DC | PRN

## 2018-07-02 MED ORDER — FENTANYL 2.5 MCG/ML BUPIVACAINE 1/10 % EPIDURAL INFUSION (WH - ANES)
14.0000 mL/h | INTRAMUSCULAR | Status: DC | PRN
  Administered 2018-07-02 (×2): 14 mL/h via EPIDURAL
  Filled 2018-07-02 (×2): qty 100

## 2018-07-02 MED ORDER — OXYTOCIN 40 UNITS IN LACTATED RINGERS INFUSION - SIMPLE MED
1.0000 m[IU]/min | INTRAVENOUS | Status: DC
  Filled 2018-07-02: qty 1000

## 2018-07-02 MED ORDER — ZOLPIDEM TARTRATE 5 MG PO TABS: 5.0000 mg | ORAL_TABLET | Freq: Every evening | ORAL | Status: DC | PRN

## 2018-07-02 MED ORDER — PENICILLIN G 3 MILLION UNITS IVPB - SIMPLE MED
3.0000 10*6.[IU] | INTRAVENOUS | Status: DC
  Administered 2018-07-02 (×4): 3 10*6.[IU] via INTRAVENOUS
  Filled 2018-07-02 (×5): qty 100

## 2018-07-02 MED ORDER — SODIUM CHLORIDE 0.9 % IV SOLN
5.0000 10*6.[IU] | Freq: Once | INTRAVENOUS | Status: AC
  Administered 2018-07-02: 5 10*6.[IU] via INTRAVENOUS
  Filled 2018-07-02: qty 5

## 2018-07-02 MED ORDER — PHENYLEPHRINE 40 MCG/ML (10ML) SYRINGE FOR IV PUSH (FOR BLOOD PRESSURE SUPPORT)
80.0000 ug | PREFILLED_SYRINGE | INTRAVENOUS | Status: DC | PRN
  Filled 2018-07-02: qty 5

## 2018-07-02 MED ORDER — TERBUTALINE SULFATE 1 MG/ML IJ SOLN
0.2500 mg | Freq: Once | INTRAMUSCULAR | Status: AC | PRN
  Administered 2018-07-02: 0.25 mg via SUBCUTANEOUS
  Filled 2018-07-02: qty 1

## 2018-07-02 MED ORDER — ONDANSETRON HCL 4 MG/2ML IJ SOLN: 4.0000 mg | Freq: Four times a day (QID) | INTRAMUSCULAR | Status: DC | PRN

## 2018-07-02 MED ORDER — SOD CITRATE-CITRIC ACID 500-334 MG/5ML PO SOLN
30.0000 mL | ORAL | Status: DC | PRN
  Administered 2018-07-02: 30 mL via ORAL
  Filled 2018-07-02: qty 15

## 2018-07-02 MED ORDER — LACTATED RINGERS IV SOLN: 500.0000 mL | Freq: Once | INTRAVENOUS | Status: DC

## 2018-07-02 MED ORDER — LACTATED RINGERS IV SOLN
INTRAVENOUS | Status: DC
  Administered 2018-07-02 (×2): via INTRAVENOUS

## 2018-07-02 MED ORDER — OXYTOCIN BOLUS FROM INFUSION: 500.0000 mL | Freq: Once | INTRAVENOUS | Status: DC

## 2018-07-02 MED ORDER — PHENYLEPHRINE 40 MCG/ML (10ML) SYRINGE FOR IV PUSH (FOR BLOOD PRESSURE SUPPORT)
80.0000 ug | PREFILLED_SYRINGE | INTRAVENOUS | Status: DC | PRN
  Filled 2018-07-02: qty 10
  Filled 2018-07-02: qty 5

## 2018-07-02 MED ORDER — LIDOCAINE HCL (PF) 1 % IJ SOLN
30.0000 mL | INTRAMUSCULAR | Status: DC | PRN
  Filled 2018-07-02: qty 30

## 2018-07-02 NOTE — Progress Notes (Signed)
Patient ID: Hillery HunterBhawna Paulsen, female   DOB: 08/09/1986, 32 y.o.   MRN: [id number] Subjective: Doing well, pain well controlled with Epidural.  Objective: BP 108/68   Pulse 70   Temp 97.7 F (36.5 C)   Resp 18   Ht 5\' 5"  (1.651 m)   Wt 74.7 kg   SpO2 98%   BMI 27.41 kg/m   FHT:  FHR: 130 bpm, variability: moderate,  accelerations:  Present,  decelerations:  Absent , recurrent varaible/ late decels resolved with UCs spacing out after Terbutaline 0.25 mg   UC:   regular, every 3 minutes SVE:   Dilation: 3.5 Effacement (%): 90 Station: -2 Exam by:: Dr. Juliene PinaMody AROM, clear fluid, station high. Fowler position and reassess in 2 hr   Assessment / Plan: G2P0, IOL 39 wks for A2GDM, progressing well, assess for descent. [facility] at 99% at 37 wks   Fetal Wellbeing:  Category I  Now since UCs spaced out and decels resolved Pain Control:  Epidural  Anticipated MOD:  Joya MartyrGuarded   Azharia Surratt R Laurali Goddard 07/02/2018, 4:07 PM

## 2018-07-02 NOTE — Progress Notes (Signed)
As disc with Dr Juliene PinaMody Intracervical balloon placement attempted w/o success. Cervix very posterior and lateral. Pt did not tolerate attempt well During placement 3 mins decel down to 100's noted in conjunction with tetanic ctx. Return to baseline thereafter  Dr Juliene PinaMody notified and disc poss terb use and ctxs are frequent and related to prior cytotec use. She will disc with RN

## 2018-07-02 NOTE — Progress Notes (Signed)
Hypoglycemic Event  CBG: 61 Treatment: popcicle given Symptoms:none  None  Follow-up CBG: Time:1900 CBG Result:54  Possible Reasons for Event: pt on clear liquid diet during labor  Comments/MD notified:Dr. Juliene PinaMody called, notified of intervention, will give pt apple juice and recheck CBG in 15 min   Jamelle Rushingavis, Solmon Bohr Hussey

## 2018-07-02 NOTE — Anesthesia Procedure Notes (Signed)
Epidural Patient location during procedure: OB Start time: 07/02/2018 1:35 PM End time: 07/02/2018 1:40 PM  Staffing Anesthesiologist: Bethena Midgetddono, Orean Giarratano, MD  Preanesthetic Checklist Completed: patient identified, site marked, surgical consent, pre-op evaluation, timeout performed, IV checked, risks and benefits discussed and monitors and equipment checked  Epidural Patient position: sitting Prep: site prepped and draped and DuraPrep Patient monitoring: continuous pulse ox and blood pressure Approach: midline Location: L3-L4 Injection technique: LOR air  Needle:  Needle type: Tuohy  Needle gauge: 17 G Needle length: 9 cm and 9 Needle insertion depth: 5 cm cm Catheter type: closed end flexible Catheter size: 19 Gauge Catheter at skin depth: 10 cm Test dose: negative  Assessment Events: blood not aspirated, injection not painful, no injection resistance, negative IV test and no paresthesia

## 2018-07-02 NOTE — Progress Notes (Signed)
Patient ID: Bonnie Thornton, female   DOB: [birth year], 32 y.o.   MRN: [id number] RN reports now temp 101.3 Start Mefoxin 2 gm q 6 hrs, Tylenol one dose 1 gm

## 2018-07-02 NOTE — H&P (Signed)
Bonnie Thornton is a 32 y.o. female presenting for IOL at 39 wks for A2GDM. G2P0, 1 SAB hx.  EDC by 1st trim sono.  PCOS hx. Spontaneous pregnancy. Good PNCare from 7 wks.  Serial growth sono in 3rd trim. Antesting with NST/BPP from 34 wks Last sono 37.4 wks- 7'14" at 89% and AC 99%   OB History    Gravida  2   Para      Term      Preterm      AB  1   Living        SAB  1   TAB      Ectopic      Multiple      Live Births             Past Medical History:  Diagnosis Date  . Gestational diabetes    metformin  . Missed abortion 06/10/2017  . PCOS (polycystic ovarian syndrome)   . Subchorionic hematoma 05/2017   Past Surgical History:  Procedure Laterality Date  . DILATION AND CURETTAGE OF UTERUS    . DILATION AND EVACUATION N/A 06/10/2017   Procedure: DILATATION AND EVACUATION;  Surgeon: Shea EvansMody, Chenay Nesmith, MD;  Location: [facility];  Service: Gynecology;  Laterality: N/A;   Family History: family history includes Diabetes in her father. Social History:  reports that she has never smoked. She has never used smokeless tobacco. She reports that she does not drink alcohol or use drugs.     Maternal Diabetes: Yes:  Diabetes Type:  Insulin/Medication controlled  Genetic Screening: Normal Maternal Ultrasounds/Referrals: Normal Fetal Ultrasounds or other Referrals:  None Maternal Substance Abuse:  No Significant Maternal Medications:  Meds include: Other:  Metformin 750mg  XL at night  Significant Maternal Lab Results:  Lab values include: Group B Strep positive Other Comments:  None  ROS History Dilation: 3.5 Effacement (%): 90 Station: -2 Exam by:: Dr. Juliene PinaMody Blood pressure 103/64, pulse 69, temperature 97.7 F (36.5 C), resp. rate 18, height 5\' 5"  (1.651 m), weight 74.7 kg, SpO2 98 %, unknown if currently breastfeeding. Exam Physical Exam  Prenatal labs: ABO, Rh: --/--/O POS, O POS Performed at [facility], [address] [place], [place] [address]  [phone](11/15 0115) Antibody: NEG (11/15 0115) Rubella: Immune (04/25 0000) RPR: Non Reactive (11/15 0115)  HBsAg: Negative (04/25 0000)  HIV: Non-reactive (04/25 0000)  GBS: Positive (11/01 0000)  Abn 3hr GTT  Assessment/Plan: 32 yo G2P0010, 39 wks, A2GDM. Here for IOL,  IOL with cycotec and then pitocin as needed. BS q 4 hrs now and q 2 hrs in active labor EFW 8 lbs with borderline pelvis, working towared vaginal delivery but increased C/s risk and increased shoulder dystocia risk with AC 99%at 37 wks.   Robley FriesVaishali R Rosa Gambale 07/02/2018, 3:07 PM

## 2018-07-02 NOTE — Progress Notes (Signed)
Re-checked patient's CBG with a result of 82.   Reginold AgentEmily Yeshua Stryker, RN

## 2018-07-02 NOTE — Progress Notes (Signed)
Patient ID: Bonnie Thornton, female   DOB: 1985/09/24, 32 y.o.   MRN: [id number] Complete and pushing +2 station, poor maternal effort  BP 109/71   Pulse 68   Temp 98.8 F (37.1 C) (Oral)   Resp 18   Ht 5\' 5"  (1.651 m)   Wt 74.7 kg   SpO2 98%   BMI 27.41 kg/m   FHT change in baseline from 130s to 150s at 7.30 pm still cat I with mod variability, + accels, no decels, now change to 170s since 8.40 pm with pushing. Overall change in baseline with no decels, watch for other signs of chorio Toco q 3 min.  Per RN temp slightly up in 99' and vaginal exam feels warm. Closely monitor for fever/ chorio.  GBS(+). PCN since admission, several doses GDMA2 - BS within range   V.Juliene PinaMody, MD

## 2018-07-02 NOTE — Anesthesia Preprocedure Evaluation (Signed)
Anesthesia Evaluation  Patient identified by MRN, date of birth, ID band Patient awake    Reviewed: Allergy & Precautions, NPO status , Patient's Chart, lab work & pertinent test results  Airway Mallampati: II  TM Distance: >3 FB Neck ROM: Full    Dental no notable dental hx.    Pulmonary neg pulmonary ROS,    Pulmonary exam normal breath sounds clear to auscultation       Cardiovascular negative cardio ROS Normal cardiovascular exam Rhythm:Regular Rate:Normal     Neuro/Psych negative neurological ROS  negative psych ROS   GI/Hepatic negative GI ROS, Neg liver ROS,   Endo/Other  negative endocrine ROSdiabetes  Renal/GU negative Renal ROS     Musculoskeletal negative musculoskeletal ROS (+)   Abdominal   Peds  Hematology negative hematology ROS (+)   Anesthesia Other Findings   Reproductive/Obstetrics negative OB ROS                             Anesthesia Physical  Anesthesia Plan  ASA: II  Anesthesia Plan: Epidural   Post-op Pain Management:    Induction: Intravenous  PONV Risk Score and Plan: 4 or greater  Airway Management Planned:   Additional Equipment:   Intra-op Plan:   Post-operative Plan:   Informed Consent: I have reviewed the patients History and Physical, chart, labs and discussed the procedure including the risks, benefits and alternatives for the proposed anesthesia with the patient or authorized representative who has indicated his/her understanding and acceptance.   Dental advisory given  Plan Discussed with: CRNA  Anesthesia Plan Comments: (Labs checked- platelets confirmed with RN in room. Fetal heart tracing, per RN, reported to be stable enough for sitting procedure. Discussed epidural, and patient consents to the procedure:  included risk of possible headache,backache, failed block, allergic reaction, and nerve injury. This patient was asked if she  had any questions or concerns before the procedure started.)        Anesthesia Quick Evaluation

## 2018-07-02 NOTE — Anesthesia Pain Management Evaluation Note (Signed)
  CRNA Pain Management Visit Note  Patient: Bonnie Thornton, 32 y.o., female  "Hello I am a member of the anesthesia team at [facility]. We have an anesthesia team available at all times to provide care throughout the hospital, including epidural management and anesthesia for C-section. I don't know your plan for the delivery whether it a natural birth, water birth, IV sedation, nitrous supplementation, doula or epidural, but we want to meet your pain goals."   1.Was your pain managed to your expectations on prior hospitalizations?   No prior hospitalizations  2.What is your expectation for pain management during this hospitalization?     Epidural  3.How can we help you reach that goal? Epidural when desired  Record the patient's initial score and the patient's pain goal.   Pain: 0  Pain Goal: 4 [facility] wants you to be able to say your pain was always managed very well.  Casandra Dallaire 07/02/2018

## 2018-07-03 DIAGNOSIS — O411290 Chorioamnionitis, unspecified trimester, not applicable or unspecified: Secondary | ICD-10-CM | POA: Diagnosis not present

## 2018-07-03 LAB — CBC
HCT: 35.1 % — ABNORMAL LOW (ref 36.0–46.0)
HEMOGLOBIN: 11.5 g/dL — AB (ref 12.0–15.0)
MCH: 27.6 pg (ref 26.0–34.0)
MCHC: 32.8 g/dL (ref 30.0–36.0)
MCV: 84.4 fL (ref 80.0–100.0)
NRBC: 0 % (ref 0.0–0.2)
Platelets: 201 10*3/uL (ref 150–400)
RBC: 4.16 MIL/uL (ref 3.87–5.11)
RDW: 13.4 % (ref 11.5–15.5)
WBC: 11.5 10*3/uL — AB (ref 4.0–10.5)

## 2018-07-03 MED ORDER — ONDANSETRON HCL 4 MG PO TABS: 4.0000 mg | ORAL_TABLET | ORAL | Status: DC | PRN

## 2018-07-03 MED ORDER — ACETAMINOPHEN 325 MG PO TABS: 650.0000 mg | ORAL_TABLET | ORAL | Status: DC | PRN

## 2018-07-03 MED ORDER — ZOLPIDEM TARTRATE 5 MG PO TABS: 5.0000 mg | ORAL_TABLET | Freq: Every evening | ORAL | Status: DC | PRN

## 2018-07-03 MED ORDER — SENNOSIDES-DOCUSATE SODIUM 8.6-50 MG PO TABS
2.0000 | ORAL_TABLET | ORAL | Status: DC
  Administered 2018-07-03 – 2018-07-04 (×2): 2 via ORAL
  Filled 2018-07-03 (×2): qty 2

## 2018-07-03 MED ORDER — DIBUCAINE 1 % RE OINT
1.0000 "application " | TOPICAL_OINTMENT | RECTAL | Status: DC | PRN
  Administered 2018-07-03: 1 via RECTAL
  Filled 2018-07-03: qty 28

## 2018-07-03 MED ORDER — SIMETHICONE 80 MG PO CHEW: 80.0000 mg | CHEWABLE_TABLET | ORAL | Status: DC | PRN

## 2018-07-03 MED ORDER — DIPHENHYDRAMINE HCL 25 MG PO CAPS: 25.0000 mg | ORAL_CAPSULE | Freq: Four times a day (QID) | ORAL | Status: DC | PRN

## 2018-07-03 MED ORDER — COCONUT OIL OIL: 1.0000 "application " | TOPICAL_OIL | Status: DC | PRN

## 2018-07-03 MED ORDER — BENZOCAINE-MENTHOL 20-0.5 % EX AERO
1.0000 "application " | INHALATION_SPRAY | CUTANEOUS | Status: DC | PRN
  Administered 2018-07-03: 1 via TOPICAL
  Filled 2018-07-03: qty 56

## 2018-07-03 MED ORDER — TETANUS-DIPHTH-ACELL PERTUSSIS 5-2.5-18.5 LF-MCG/0.5 IM SUSP: 0.5000 mL | Freq: Once | INTRAMUSCULAR | Status: DC

## 2018-07-03 MED ORDER — PRENATAL MULTIVITAMIN CH
1.0000 | ORAL_TABLET | Freq: Every day | ORAL | Status: DC
  Administered 2018-07-03 – 2018-07-04 (×2): 1 via ORAL
  Filled 2018-07-03 (×2): qty 1

## 2018-07-03 MED ORDER — ONDANSETRON HCL 4 MG/2ML IJ SOLN: 4.0000 mg | INTRAMUSCULAR | Status: DC | PRN

## 2018-07-03 MED ORDER — WITCH HAZEL-GLYCERIN EX PADS
1.0000 "application " | MEDICATED_PAD | CUTANEOUS | Status: DC | PRN
  Administered 2018-07-03: 1 via TOPICAL

## 2018-07-03 MED ORDER — IBUPROFEN 600 MG PO TABS
600.0000 mg | ORAL_TABLET | Freq: Four times a day (QID) | ORAL | Status: DC
  Administered 2018-07-03 – 2018-07-04 (×6): 600 mg via ORAL
  Filled 2018-07-03 (×8): qty 1

## 2018-07-03 NOTE — Progress Notes (Signed)
Patient ID: Hillery HunterBhawna Thornton, female   DOB: 09-Jul-1986, 32 y.o.   MRN: [id number] PPD # 1 S/P NSVD  Live born female  Birth Weight: 7 lb 14.6 oz (3589 g) APGAR: 8, 9  Newborn Delivery   Birth date/time:  07/02/2018 21:52:00 Delivery type:  Vaginal, Spontaneous    Baby name: no name yet Delivering provider: MODY, VAISHALI   Feeding: breast  Pain control at delivery: Epidural   S:  Reports feeling well.             Tolerating po/ No nausea or vomiting             Bleeding is moderate             Pain controlled with  PO meds             Up ad lib / ambulatory / voiding without difficulties   O:  A & O x 3, in no apparent distress              VS:  Vitals:   07/03/18 0016 07/03/18 0045 07/03/18 0205 07/03/18 0605  BP: 94/61 92/60 93/68  98/75  Pulse: 99 88 97 83  Resp: 18 (!) 24 18 20   Temp: 99.4 F (37.4 C) 98.9 F (37.2 C) 99 F (37.2 C) (!) 97.5 F (36.4 C)  TempSrc: Oral Oral Oral Oral  SpO2:  100%    Weight:      Height:        LABS:  Recent Labs    07/02/18 0115 07/03/18 0543  WBC 7.8 11.5*  HGB 13.8 11.5*  HCT 42.2 35.1*  PLT 245 201    Blood type: --/--/O POS, O POS Performed at [facility], [address] [place], [place] [address]  [phone](11/15 0115)  Rubella: Immune (04/25 0000)   I&O: I/O last 3 completed shifts: In: -  Out: 1783 [Urine:1300; Blood:483]          No intake/output data recorded.  Vaccines: TDaP UTD         Flu    UTD   Lungs: Clear and unlabored  Heart: regular rate and rhythm / no murmurs  Abdomen: soft, non-tender, non-distended             Fundus: firm, non-tender, U-1  Perineum: repair intact  Lochia: small  Extremities: trace pedal edema, no calf pain or tenderness    A/P: PPD # 1 32 y.o., G2P1011   Principal Problem:   SVD 11/15 Active Problems:   Term pregnancy   GDM, class A2  - F/U PP 2 GTT in office   Postpartum care following vaginal delivery   Chorioamnionitis (presumed)  - s/p Mefoxin 1 dose, afebrile  since birth   Third degree perineal laceration  - bowel regimen  Doing well - stable status  Routine post partum orders  Anticipate discharge tomorrow    Neta Mendsaniela C Paul, MSN, CNM 07/03/2018, 9:32 AM

## 2018-07-03 NOTE — Lactation Note (Signed)
This note was copied from a baby's chart. Lactation Consultation Note  Patient Name: Girl Hillery HunterBhawna Hulick ZOXWR'UToday's Date: 07/03/2018 Reason for consult: Initial assessment;Primapara;1st time breastfeeding  Visited with P1 Mom of term infant at 1617 hr old.  Mom has breast fed baby with RN assisting, latch score of 8.   Baby currently spitting large amount of clear, milky emesis.   Reviewed breast massage and hand expression.  Dot of colostrum expressed.   Baby not interested in latching right now.  Placed baby STS on Mom's chest recommending baby stay there to help settle baby's digestion.   Mom to call prn for assist when baby is cueing to eat. Reassured Mom that she would get help.  Lactation brochure left with Mom.  Mom aware of IP and OP lactation support available to her.   Interventions Interventions: Breast feeding basics reviewed;Skin to skin;Breast massage;Hand express  Lactation Tools Discussed/Used WIC Program: No   Consult Status Consult Status: Follow-up Date: 07/04/18 Follow-up type: In-patient    Judee ClaraSmith, Edouard Gikas E 07/03/2018, 3:35 PM

## 2018-07-03 NOTE — Anesthesia Postprocedure Evaluation (Signed)
Anesthesia Post Note  Patient: Bonnie Thornton  Procedure(s) Performed: AN AD HOC LABOR EPIDURAL     Patient location during evaluation: Mother Baby Anesthesia Type: Epidural Level of consciousness: awake and alert and oriented Pain management: satisfactory to patient Vital Signs Assessment: post-procedure vital signs reviewed and stable Respiratory status: respiratory function stable Cardiovascular status: stable Postop Assessment: no headache, no backache, epidural receding, patient able to bend at knees, no signs of nausea or vomiting and adequate PO intake Anesthetic complications: no    Last Vitals:  Vitals:   07/03/18 0205 07/03/18 0605  BP: 93/68 98/75  Pulse: 97 83  Resp: 18 20  Temp: 37.2 C (!) 36.4 C  SpO2:      Last Pain:  Vitals:   07/03/18 0605  TempSrc: Oral  PainSc: 3    Pain Goal: Patients Stated Pain Goal: 2 (07/03/18 [id number])               Karleen DolphinFUSSELL,Rakiyah Esch

## 2018-07-04 MED ORDER — ACETAMINOPHEN 325 MG PO TABS: 650.0000 mg | ORAL_TABLET | ORAL | Status: AC | PRN

## 2018-07-04 MED ORDER — SENNOSIDES-DOCUSATE SODIUM 8.6-50 MG PO TABS: 2.0000 | ORAL_TABLET | ORAL | Status: AC

## 2018-07-04 MED ORDER — COCONUT OIL OIL: 1.0000 "application " | TOPICAL_OIL | 0 refills | Status: AC | PRN

## 2018-07-04 MED ORDER — BENZOCAINE-MENTHOL 20-0.5 % EX AERO: 1.0000 "application " | INHALATION_SPRAY | CUTANEOUS | Status: AC | PRN

## 2018-07-04 MED ORDER — IBUPROFEN 600 MG PO TABS: 600.0000 mg | ORAL_TABLET | Freq: Four times a day (QID) | ORAL | 0 refills | Status: AC

## 2018-07-04 NOTE — Progress Notes (Signed)
Per Patient RN please let mom sleep, will do d/c when she wakes up

## 2018-07-04 NOTE — Discharge Summary (Signed)
OB Discharge Summary  Patient Name: Bonnie Thornton DOB: 09-30-1985 MRN: [id number]  Date of admission: 07/02/2018 Delivering provider: MODY, VAISHALI   Date of discharge: 07/04/2018  Admitting diagnosis: INDUCTION Intrauterine pregnancy: 377w0d     Secondary diagnosis:Principal Problem:   SVD 11/15 Active Problems:   GDM, class A2   Postpartum care following vaginal delivery   Third degree perineal laceration  Additional problems: none     Discharge diagnosis:  Patient Active Problem List   Diagnosis Date Noted  . SVD 11/15 07/03/2018  . Postpartum care following vaginal delivery 07/03/2018  . Third degree perineal laceration 07/03/2018  . GDM, class A2 07/02/2018                                                                Post partum procedures:none  Augmentation: AROM, Pitocin and Cytotec Pain control: Epidural  Laceration:3rd degree;Perineal  Episiotomy:None  Complications: None  Hospital course:  Induction of Labor With Vaginal Delivery   32 y.o. yo G2P1011 at 3977w0d was admitted to the hospital 07/02/2018 for induction of labor.  Indication for induction: A2 DM.  Patient had an uncomplicated labor course as follows: Membrane Rupture Time/Date: 2:12 PM ,07/02/2018   Intrapartum Procedures: Episiotomy: None [1]                                         Lacerations:  3rd degree [4];Perineal [11]  Patient had delivery of a Viable infant.  Information for the patient's newborn:  Bonnie Thornton, Girl Bonnie Thornton [[id number]][[id number]]  Delivery Method: Vag-Spont   07/02/2018  Details of delivery can be found in separate delivery note.  Patient had a routine postpartum course. Patient is discharged home 07/04/18.  Physical exam  Vitals:   07/03/18 1020 07/03/18 1457 07/04/18 0003 07/04/18 0535  BP: 94/61 108/66 (!) 82/71 103/76  Pulse: 60 66 75 74  Resp: 18 18 18 20   Temp: (!) 97.3 F (36.3 C) 97.7 F (36.5 C) 97.7 F (36.5 C) 97.7 F (36.5 C)  TempSrc:   Oral Oral  SpO2:     100%  Weight:      Height:       General: alert, cooperative and no distress Lochia: appropriate Uterine Fundus: firm Incision: Healing well with no significant drainage DVT Evaluation: No cords or calf tenderness. No significant calf/ankle edema. Labs: Lab Results  Component Value Date   WBC 11.5 (H) 07/03/2018   HGB 11.5 (L) 07/03/2018   HCT 35.1 (L) 07/03/2018   MCV 84.4 07/03/2018   PLT 201 07/03/2018   No flowsheet data found.  Vaccines: TDaP UTD         Flu    UTD  Discharge instruction: per After Visit Summary and "Baby and Me Booklet".  After Visit Meds:  Allergies as of 07/04/2018   No Known Allergies     Medication List    TAKE these medications   acetaminophen 325 MG tablet Commonly known as:  TYLENOL Take 2 tablets (650 mg total) by mouth every 4 (four) hours as needed (for pain scale < 4).   benzocaine-Menthol 20-0.5 % Aero Commonly known as:  DERMOPLAST Apply 1 application topically as needed for irritation (perineal  discomfort).   coconut oil Oil Apply 1 application topically as needed.   ibuprofen 600 MG tablet Commonly known as:  ADVIL,MOTRIN Take 1 tablet (600 mg total) by mouth every 6 (six) hours.   prenatal multivitamin Tabs tablet Take 1 tablet by mouth daily at 12 noon.   senna-docusate 8.6-50 MG tablet Commonly known as:  Senokot-S Take 2 tablets by mouth daily. Start taking on:  07/05/2018            Discharge Care Instructions  (From admission, onward)         Start     Ordered   07/04/18 0000  Discharge wound care:    Comments:  Sitz baths 2 times /day with warm water x 1 week   07/04/18 1046          Diet: routine diet  Activity: Advance as tolerated. Pelvic rest for 6 weeks.   Postpartum contraception: Not Discussed  Newborn Data: Live born female  Birth Weight: 7 lb 14.6 oz (3589 g) APGAR: 8, 9  Newborn Delivery   Birth date/time:  07/02/2018 21:52:00 Delivery type:  Vaginal, Spontaneous      named Bonnie Thornton Baby Feeding: Bottle and Breast Disposition:rooming in for phototherapy   Delivery Report:  Review the Delivery Report for details.    Follow up: Follow-up Information    Shea Evans, MD. Schedule an appointment as soon as possible for a visit in 6 week(s).   Specialty:  Obstetrics and Gynecology Contact information: [address] [place] [address] [phone]             Signed: Cipriano Mile, MSN 07/04/2018, 10:46 AM

## 2018-07-04 NOTE — Lactation Note (Signed)
This note was copied from a baby's chart. Lactation Consultation Note Baby 28 hrs old. Under DPT. Very fussy. Parents taking turns letting baby suck on finger or cries. Has BF, wanting to cluster feed.  Discussed supplementing d/t DPT. Mom agree's.  RN set up DEBP. Mom is to pump q3 hrs. Mom pumped 1 ml thick colostrum. Gave w/curve tip syring. Formula gave w/575fr tubing taped to gloved finger. LC demonstrated, then FOB taught. Baby took 10 ml. Discussed supplementing, taught cleaning tubing. Discussed newborn behavior, cluster feeding, supply and demand. Parents being patient and caring even though very tired. Encouraged to call for questions or assistance.   Patient Name: Bonnie Hillery HunterBhawna Thornton RUEAV'WToday's Date: 07/04/2018 Reason for consult: Follow-up assessment;Hyperbilirubinemia   Maternal Data    Feeding Feeding Type: Breast Milk  LATCH Score Latch: Grasps breast easily, tongue down, lips flanged, rhythmical sucking.  Audible Swallowing: A few with stimulation  Type of Nipple: Everted at rest and after stimulation  Comfort (Breast/Nipple): Soft / non-tender  Hold (Positioning): Assistance needed to correctly position infant at breast and maintain latch.  LATCH Score: 8  Interventions Interventions: Expressed milk;Breast compression  Lactation Tools Discussed/Used Tools: Pump;46F feeding tube / Syringe Breast pump type: Double-Electric Breast Pump Pump Review: Setup, frequency, and cleaning;Milk Storage Initiated by:: RN Date initiated:: 07/04/18   Consult Status Consult Status: Follow-up Date: 07/04/18 Follow-up type: In-patient    Charyl DancerCARVER, Cadynce Garrette G 07/04/2018, 2:48 AM

## 2018-07-05 ENCOUNTER — Ambulatory Visit: Payer: Self-pay

## 2018-07-05 NOTE — Lactation Note (Signed)
This note was copied from a baby's chart. Lactation Consultation Note  Patient Name: Girl Hillery HunterBhawna Vanasten ZOXWR'UToday's Date: 07/05/2018 Reason for consult: Follow-up assessment Mom called out for feeding assessment.  Breasts are becoming full but not engorged.  Mom massaging breasts prior to latching infant.  Milk is easily hand expressed.  Baby sleepy and waking techniques needed.  Baby latched after a few attempts in cradle hold.  Observed a feeding for 10 minutes.  Baby pulled off and content and relaxed.  Mom offered opposite breast but baby showing no interest.  Parents have been supplementing with formula when baby acting hungry after feeds.  Instructed to hold off on formula when baby content.  Explained that now that milk is coming in baby will get fuller.  Discussed the prevention and treatment of engorgement.  Mom has a Spectra pump at home.Marland Kitchen.  Discharge instructions given and questions answered.  Lactation outpatient services and support information reviewed and encouraged.  Maternal Data    Feeding Feeding Type: Breast Fed  LATCH Score Latch: Grasps breast easily, tongue down, lips flanged, rhythmical sucking.  Audible Swallowing: A few with stimulation  Type of Nipple: Everted at rest and after stimulation  Comfort (Breast/Nipple): Soft / non-tender  Hold (Positioning): Assistance needed to correctly position infant at breast and maintain latch.  LATCH Score: 8  Interventions Interventions: Assisted with latch;Breast compression;Skin to skin;Adjust position;Breast massage;Support pillows;Hand express  Lactation Tools Discussed/Used     Consult Status Consult Status: Complete Follow-up type: Call as needed    Huston FoleyMOULDEN, Shondell Poulson S 07/05/2018, 9:59 AM

## 2018-07-09 ENCOUNTER — Inpatient Hospital Stay (HOSPITAL_COMMUNITY): Admit: 2018-07-09 | Payer: BLUE CROSS/BLUE SHIELD

## 2018-10-17 IMAGING — US US OB COMP LESS 14 WK
1 series · 15 of 28 positions shown · non-contrast
Comparison: 05/01/2017

CLINICAL DATA: Lower abdominal cramping and vaginal bleeding

EXAM:
OBSTETRIC <14 WK ULTRASOUND
TECHNIQUE: Transabdominal ultrasound was performed for evaluation of the
gestation as well as the maternal uterus and adnexal regions.

[Series 1: us ob comp less 14 wk · 31 acquisitions, 15 frames shown]
[im 1/31]
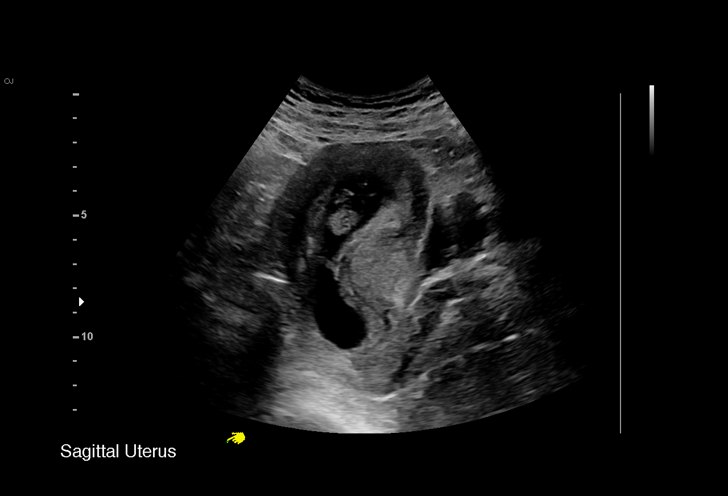
[im 3/31]
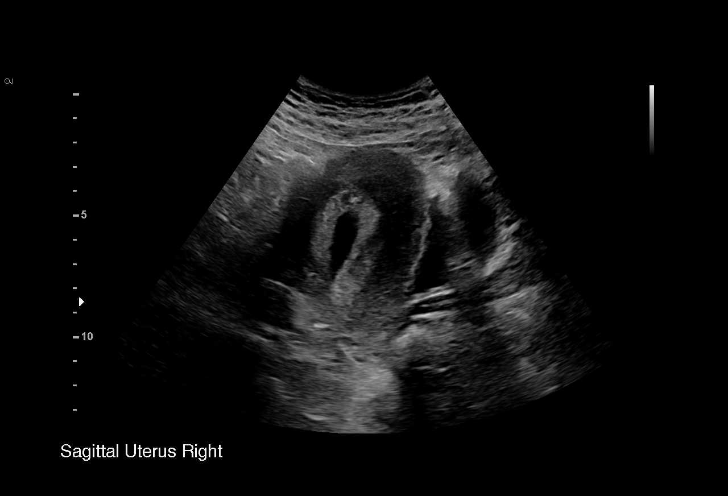
[im 5/31]
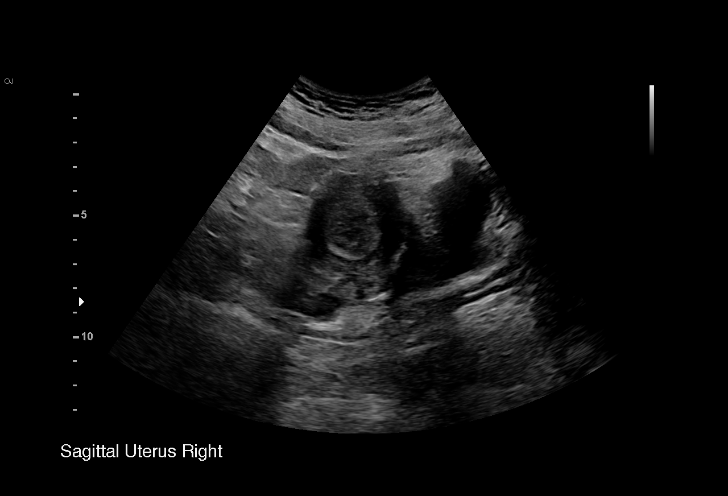
[im 7/31]
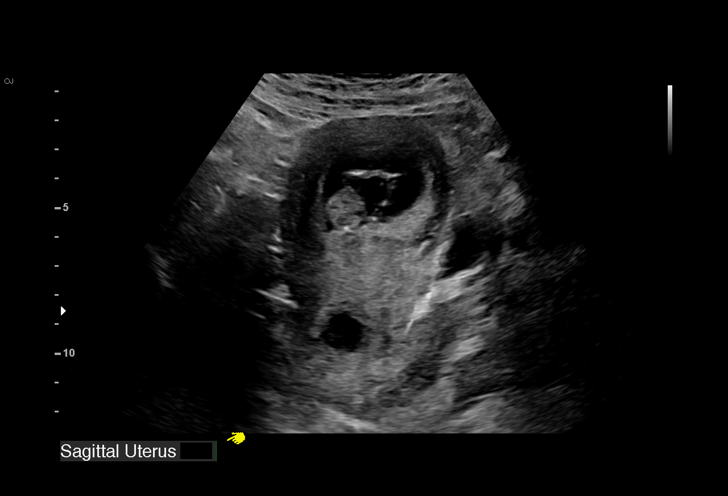
[im 9/31]
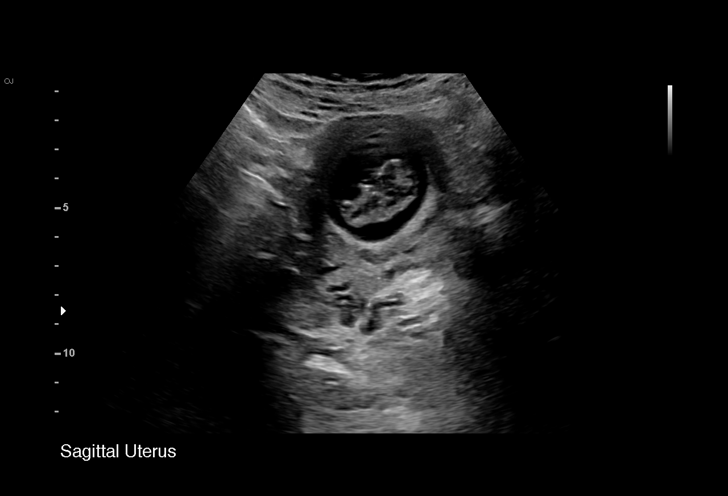
[im 12/31]
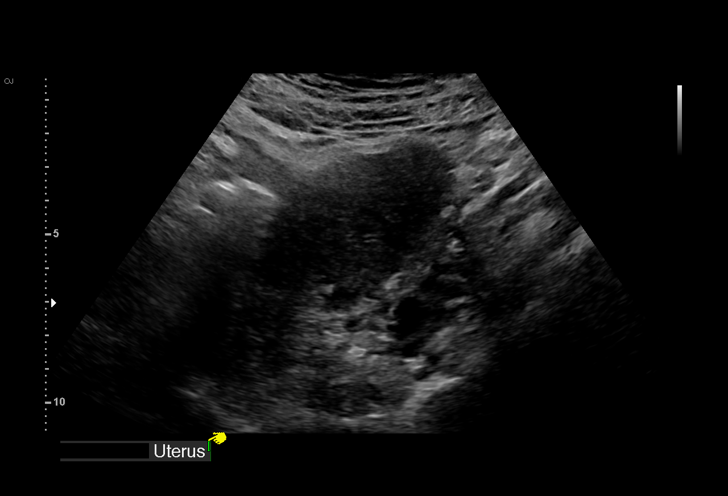
[im 14/31]
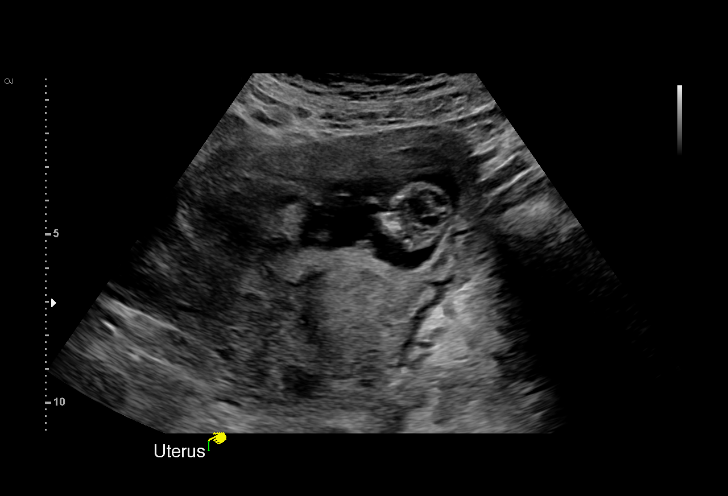
[im 16/31]
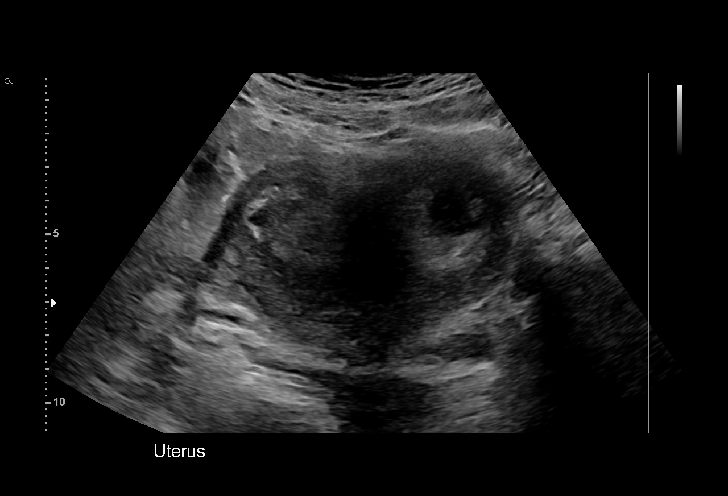
[im 17/31]
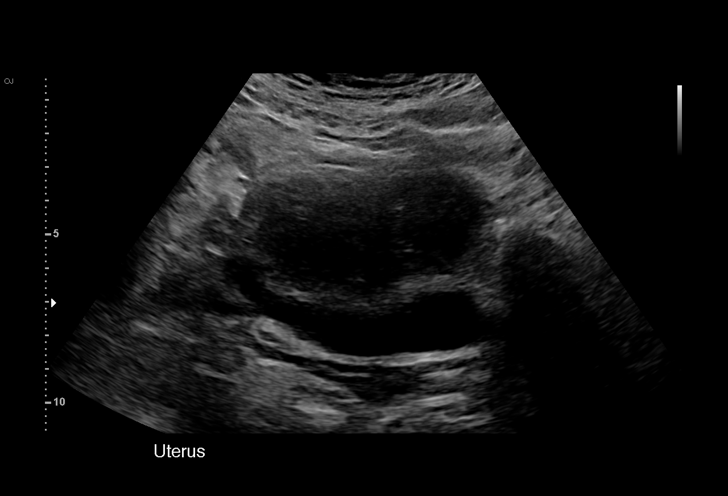
[im 19/31]
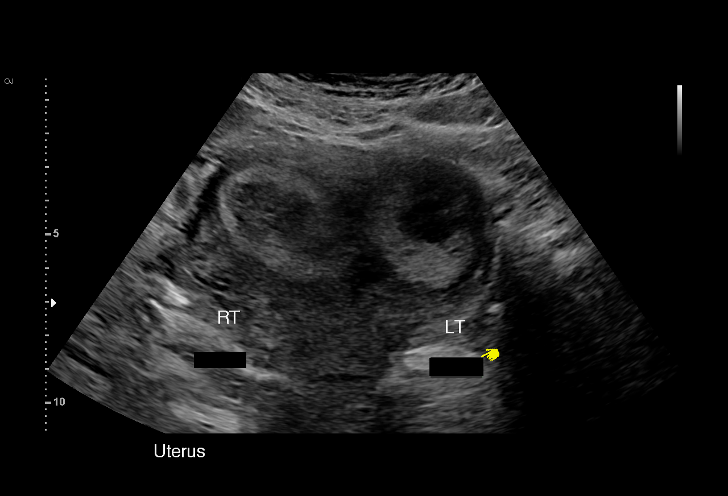
[im 22/31]
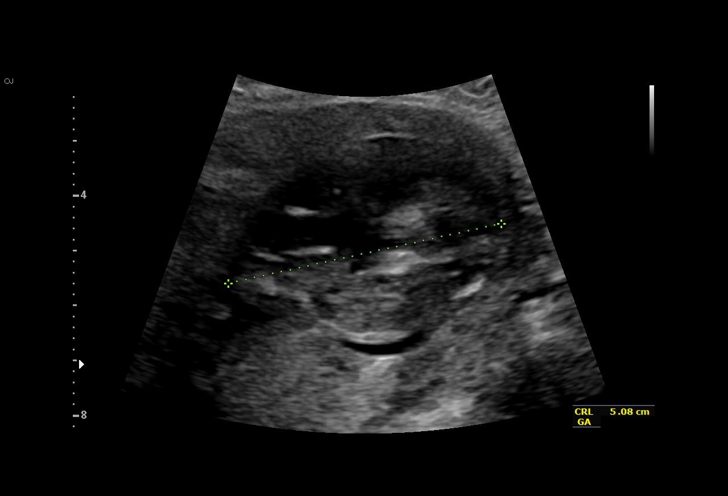
[im 24/31]
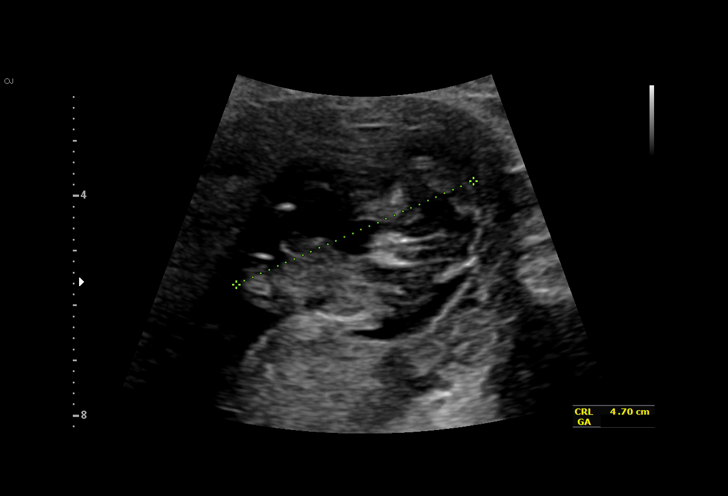
[im 26/31]
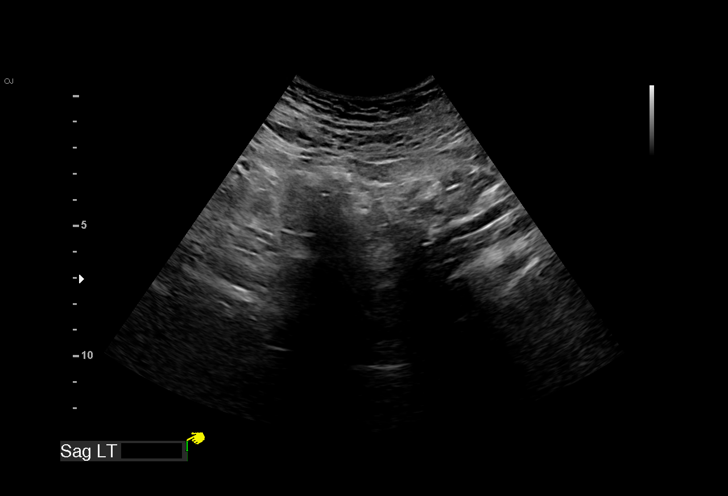
[im 28/31]
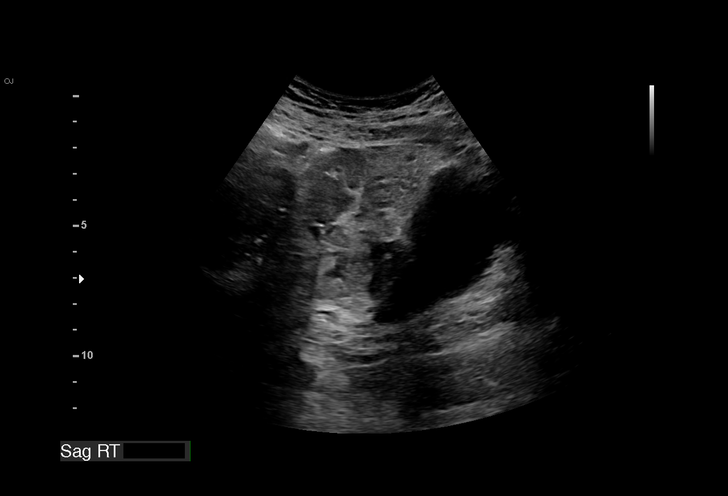
[im 31/31]
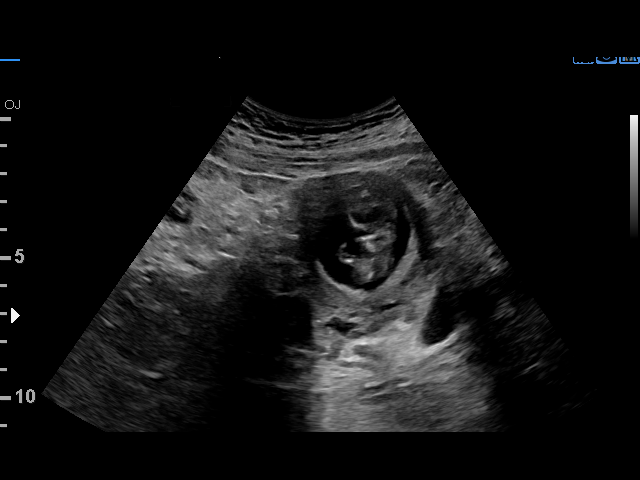

[15 of 28 positions shown; findings below may reference images not displayed]

FINDINGS: Intrauterine gestational sac: Visible

Yolk sac:  Not visible

Embryo:  Visible

Cardiac Activity: Not visible

CRL:   49  mm   11 w 4 d                  US EDC: 12/25/2017

Subchorionic hemorrhage:  None visualized.

Maternal uterus/adnexae: Both ovaries are nonvisualized.
IMPRESSION: 1. Intrauterine pregnancy with crown-rump length of 49 mm an no
fetal cardiac activity. Findings meet definitive criteria for failed
pregnancy. This follows SRU consensus guidelines: Diagnostic
Criteria for Nonviable Pregnancy Early in the First Trimester. N
Engl J Med 0131;[DATE].
2. Mullerian duct anomaly with gestation visualized in the left
horn. Findings could be secondary to septate uterus configuration
versus bicornuate uterus. Nonemergent pelvic MRI if not already
performed could help differentiate between the 2 entities.

## 2018-10-18 ENCOUNTER — Other Ambulatory Visit: Payer: Self-pay | Admitting: Obstetrics & Gynecology

## 2018-10-18 DIAGNOSIS — N6310 Unspecified lump in the right breast, unspecified quadrant: Secondary | ICD-10-CM

## 2018-10-28 ENCOUNTER — Other Ambulatory Visit: Payer: BLUE CROSS/BLUE SHIELD

## 2019-05-04 ENCOUNTER — Ambulatory Visit
Admission: RE | Admit: 2019-05-04 | Discharge: 2019-05-04 | Disposition: A | Discharge status: Home / Self Care | Payer: 59 | Source: Ambulatory Visit | Attending: Obstetrics & Gynecology | Admitting: Obstetrics & Gynecology

## 2019-05-04 ENCOUNTER — Other Ambulatory Visit: Payer: Self-pay

## 2019-05-04 ENCOUNTER — Other Ambulatory Visit: Payer: Self-pay | Admitting: Obstetrics & Gynecology

## 2019-05-04 DIAGNOSIS — N6310 Unspecified lump in the right breast, unspecified quadrant: Secondary | ICD-10-CM

## 2019-11-03 ENCOUNTER — Other Ambulatory Visit: Payer: 59

## 2019-11-14 ENCOUNTER — Ambulatory Visit: Payer: 59 | Attending: Internal Medicine

## 2020-09-10 IMAGING — US US BREAST*R* LIMITED INC AXILLA
1 series · 13 of 13 positions shown · non-contrast
Comparison: None.

CLINICAL DATA: 33-year-old female presenting for evaluation of a
palpable lump in the right breast. She first identified this at the
beginning of her pregnancy and felt that it increased in size during
pregnancy. She states she also had a miscarriage prior to this
pregnancy and felt this lump at the beginning of that pregnancy. The
palpable area disappeared following the miscarriage. She gave birth
in Monday June, 2018, and the mass has remained stable since that
time. She is currently breast feeding.

EXAM:
DIGITAL DIAGNOSTIC BILATERAL MAMMOGRAM WITH CAD AND TOMO
ULTRASOUND RIGHT BREAST

[Series 1: us breast*right* limited inc axilla · 0.06mm/px · 13 of 13 slices shown]
[im 1/13]
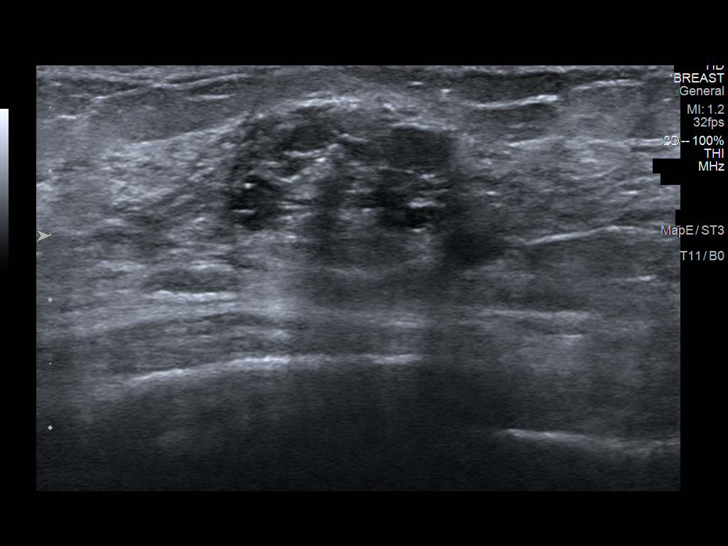
[im 2/13]
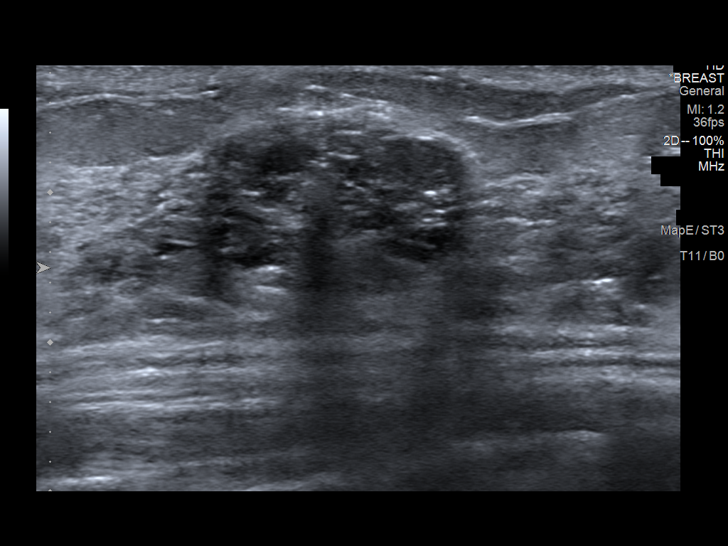
[im 3/13]
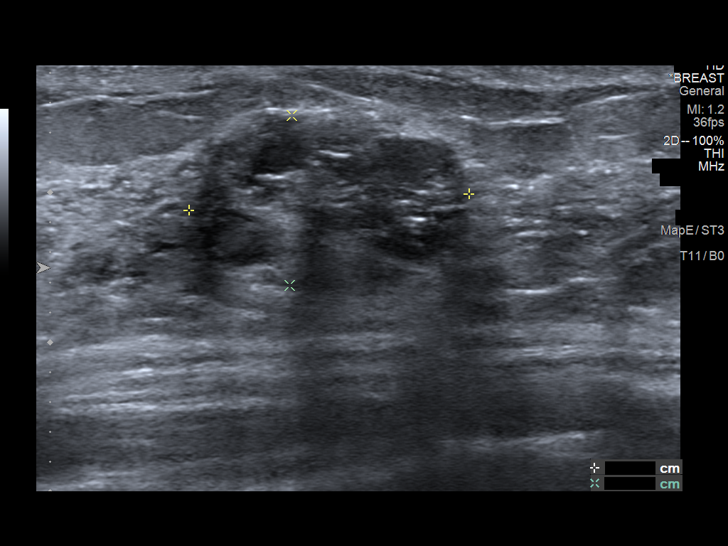
[im 4/13]
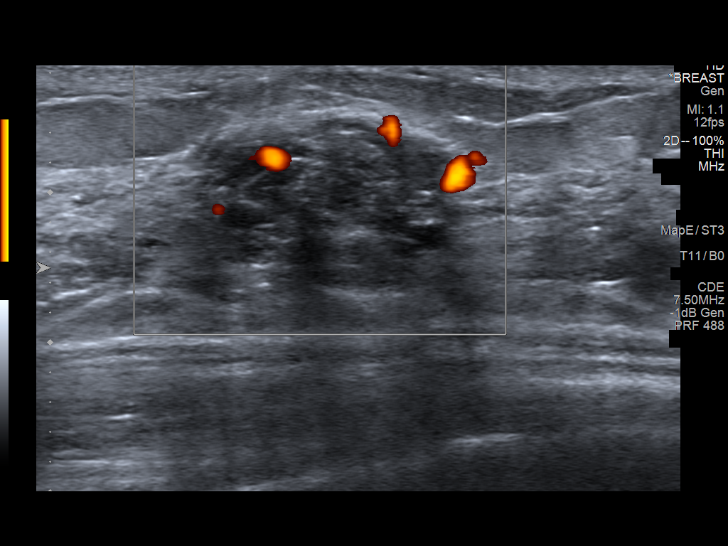
[im 5/13]
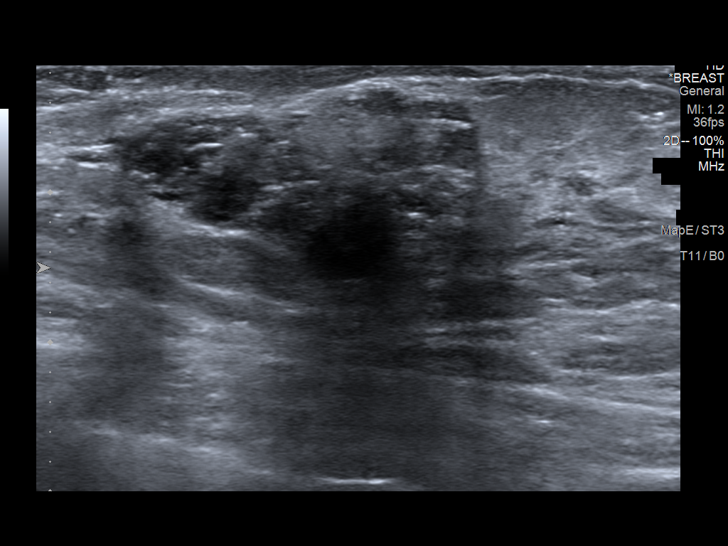
[im 6/13]
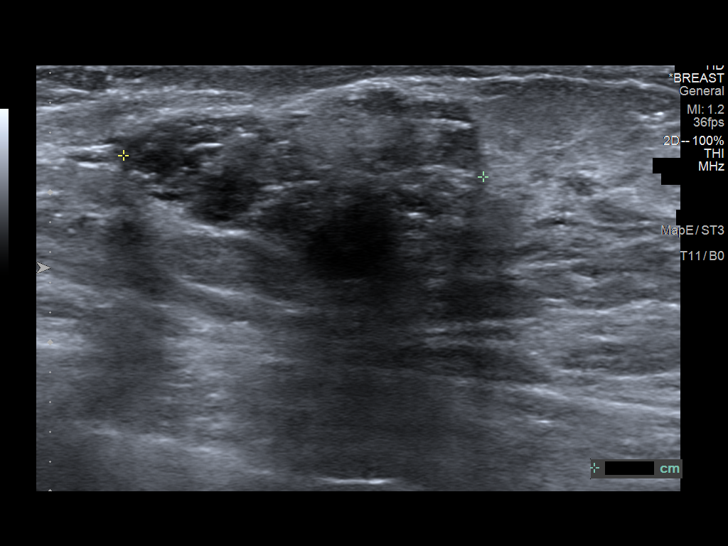
[im 7/13]
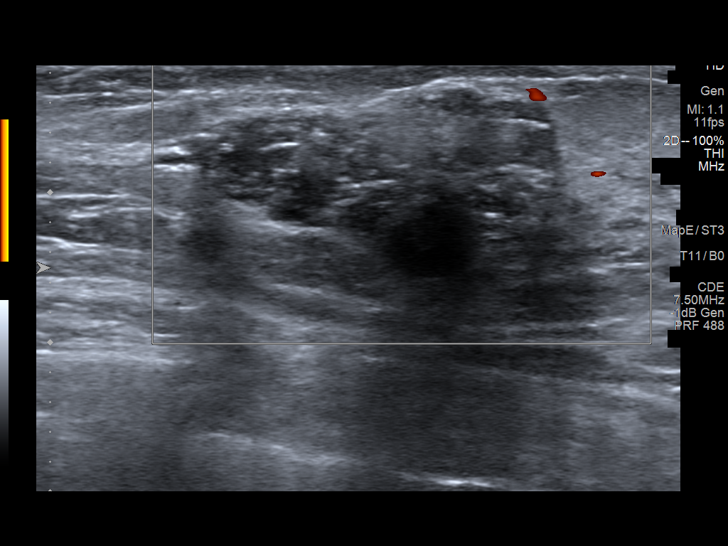
[im 8/13]
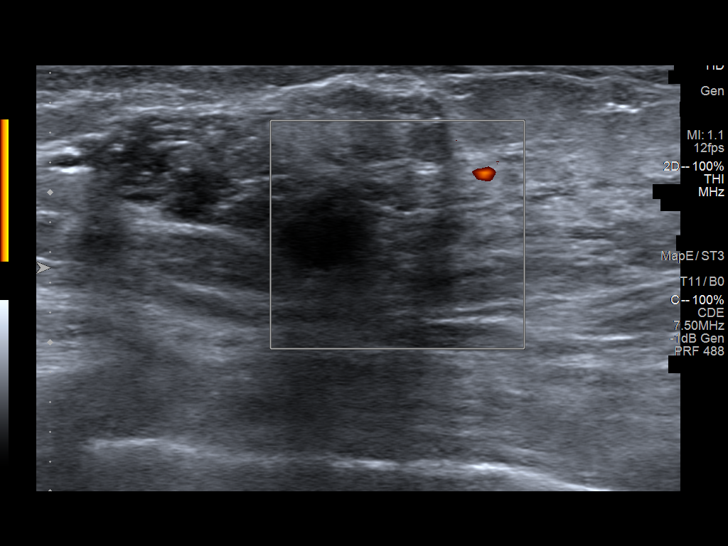
[im 9/13]
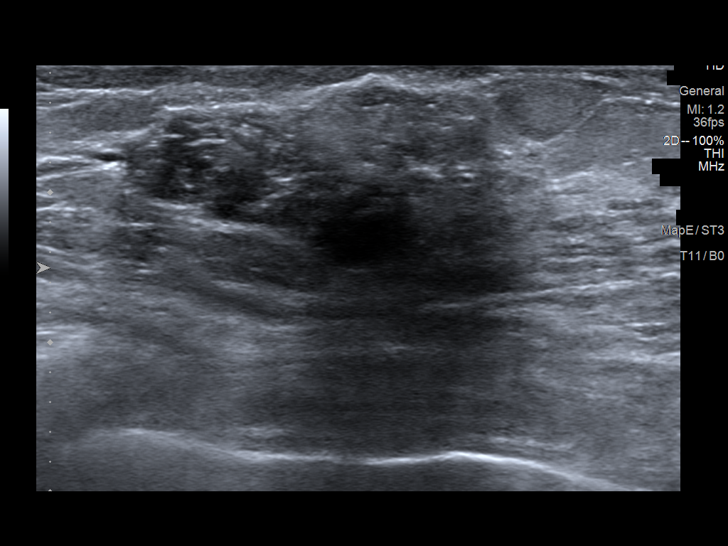
[im 10/13]
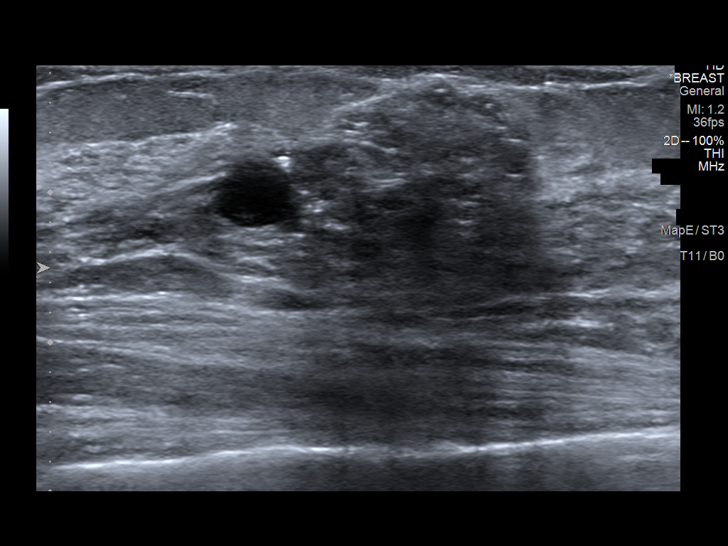
[im 11/13]
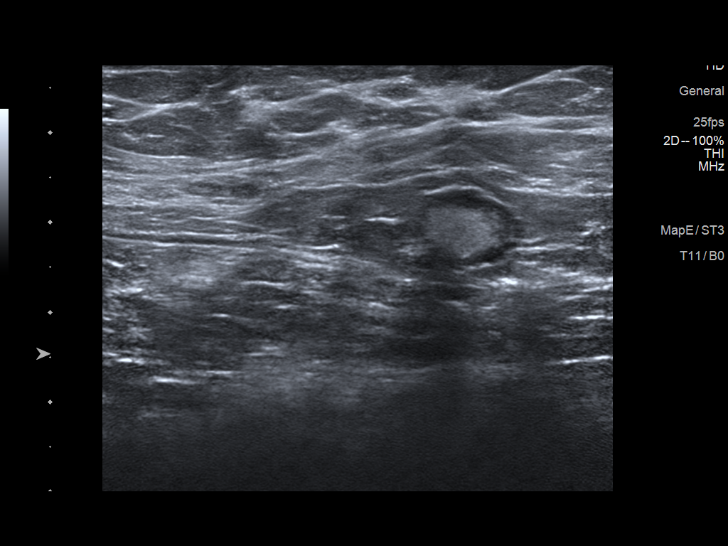
[im 12/13]
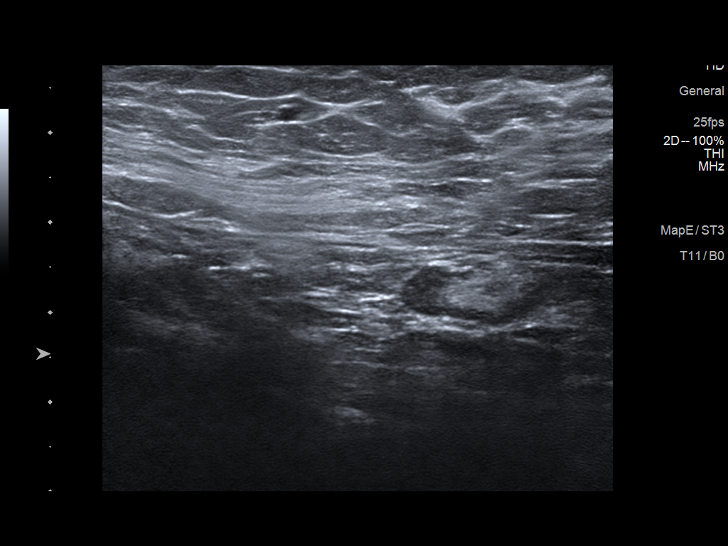
[im 13/13]
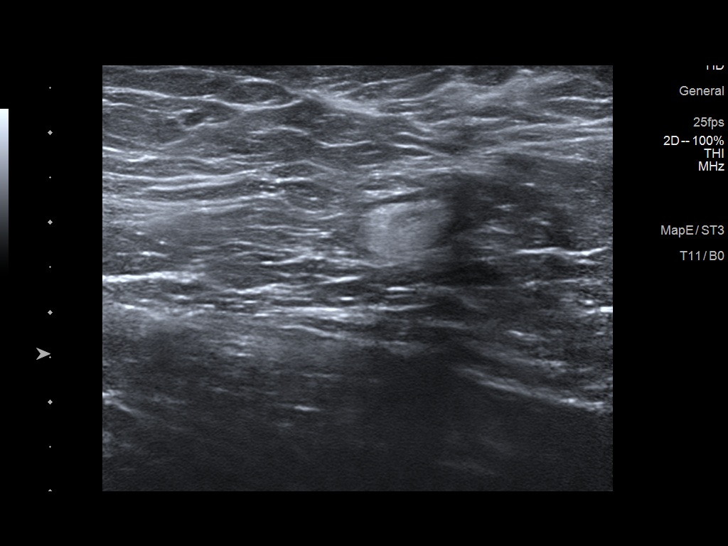

[13 of 13 positions shown; findings below may reference images not displayed]

ACR Breast Density Category d: The breast tissue is extremely dense,
which lowers the sensitivity of mammography.
FINDINGS: Tomosynthesis images through the palpable area of concern
demonstrate a multilobulated predominantly fat containing mass
measuring 2.5 cm. The margins of the locules are circumscribed. No
suspicious calcifications, masses or areas of distortion are seen in
the bilateral breasts.

Mammographic images were processed with CAD.

On physical exam, a mobile palpable lump is identified at the
palpable site in the lower outer right breast.

Ultrasound targeted to the palpable site at 8 o'clock, 5 cm from the
nipple demonstrates a hypoechoic heterogeneous mass with cystic
spaces and some mobile internal fluid measures 2.4 x 1.1 x 1.9 cm.
Normal lymph nodes are seen in the right axilla.
IMPRESSION: 1. The palpable lump in the right breast is favored to represent a
complex galactocele.

2.  No mammographic evidence of malignancy in the bilateral breasts.

RECOMMENDATION:
Six-month follow-up right breast ultrasound is recommended.

I have discussed the findings and recommendations with the patient.
If applicable, a reminder letter will be sent to the patient
regarding the next appointment.

BI-RADS CATEGORY  3: Probably benign.
